# Patient Record
Sex: Female | Born: 2000 | Race: Black or African American | Hispanic: No | Marital: Single | Smoking: Never smoker
Health system: Southern US, Community
[De-identification: ages and names within clinical notes are randomized; demographics above are authoritative.]

## PROBLEM LIST (undated history)

## (undated) DIAGNOSIS — F419 Anxiety disorder, unspecified: Secondary | ICD-10-CM

## (undated) DIAGNOSIS — Z8249 Family history of ischemic heart disease and other diseases of the circulatory system: Secondary | ICD-10-CM

## (undated) DIAGNOSIS — D509 Iron deficiency anemia, unspecified: Secondary | ICD-10-CM

## (undated) DIAGNOSIS — R002 Palpitations: Secondary | ICD-10-CM

## (undated) DIAGNOSIS — Z8049 Family history of malignant neoplasm of other genital organs: Secondary | ICD-10-CM

## (undated) DIAGNOSIS — Z8042 Family history of malignant neoplasm of prostate: Secondary | ICD-10-CM

## (undated) HISTORY — PX: WISDOM TOOTH EXTRACTION: SHX21

## (undated) HISTORY — DX: Anxiety disorder, unspecified: F41.9

## (undated) HISTORY — DX: Family history of malignant neoplasm of prostate: Z80.42

## (undated) HISTORY — DX: Family history of ischemic heart disease and other diseases of the circulatory system: Z82.49

## (undated) HISTORY — DX: Palpitations: R00.2

## (undated) HISTORY — DX: Family history of malignant neoplasm of other genital organs: Z80.49

---
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
## 2019-07-18 DIAGNOSIS — M25571 Pain in right ankle and joints of right foot: Secondary | ICD-10-CM | POA: Diagnosis not present

## 2019-09-23 DIAGNOSIS — F909 Attention-deficit hyperactivity disorder, unspecified type: Secondary | ICD-10-CM | POA: Diagnosis not present

## 2019-09-30 DIAGNOSIS — F909 Attention-deficit hyperactivity disorder, unspecified type: Secondary | ICD-10-CM | POA: Diagnosis not present

## 2019-10-07 DIAGNOSIS — F909 Attention-deficit hyperactivity disorder, unspecified type: Secondary | ICD-10-CM | POA: Diagnosis not present

## 2019-10-29 DIAGNOSIS — Z20822 Contact with and (suspected) exposure to covid-19: Secondary | ICD-10-CM | POA: Diagnosis not present

## 2019-11-04 DIAGNOSIS — F909 Attention-deficit hyperactivity disorder, unspecified type: Secondary | ICD-10-CM | POA: Diagnosis not present

## 2019-11-17 DIAGNOSIS — Z20822 Contact with and (suspected) exposure to covid-19: Secondary | ICD-10-CM | POA: Diagnosis not present

## 2019-11-18 DIAGNOSIS — F909 Attention-deficit hyperactivity disorder, unspecified type: Secondary | ICD-10-CM | POA: Diagnosis not present

## 2019-12-12 DIAGNOSIS — F419 Anxiety disorder, unspecified: Secondary | ICD-10-CM | POA: Diagnosis not present

## 2019-12-30 DIAGNOSIS — F909 Attention-deficit hyperactivity disorder, unspecified type: Secondary | ICD-10-CM | POA: Diagnosis not present

## 2020-01-06 DIAGNOSIS — F419 Anxiety disorder, unspecified: Secondary | ICD-10-CM | POA: Diagnosis not present

## 2020-01-27 DIAGNOSIS — F909 Attention-deficit hyperactivity disorder, unspecified type: Secondary | ICD-10-CM | POA: Diagnosis not present

## 2020-02-04 DIAGNOSIS — H5213 Myopia, bilateral: Secondary | ICD-10-CM | POA: Diagnosis not present

## 2020-02-12 DIAGNOSIS — H5213 Myopia, bilateral: Secondary | ICD-10-CM | POA: Diagnosis not present

## 2020-02-25 DIAGNOSIS — F909 Attention-deficit hyperactivity disorder, unspecified type: Secondary | ICD-10-CM | POA: Diagnosis not present

## 2020-03-10 DIAGNOSIS — F909 Attention-deficit hyperactivity disorder, unspecified type: Secondary | ICD-10-CM | POA: Diagnosis not present

## 2020-03-24 DIAGNOSIS — F909 Attention-deficit hyperactivity disorder, unspecified type: Secondary | ICD-10-CM | POA: Diagnosis not present

## 2020-03-31 ENCOUNTER — Ambulatory Visit (INDEPENDENT_AMBULATORY_CARE_PROVIDER_SITE_OTHER): Payer: Medicaid Other | Admitting: Family Medicine

## 2020-03-31 ENCOUNTER — Other Ambulatory Visit: Payer: Self-pay

## 2020-03-31 VITALS — BP 118/72 | HR 112 | Ht 70.0 in | Wt 156.8 lb

## 2020-03-31 DIAGNOSIS — G8929 Other chronic pain: Secondary | ICD-10-CM | POA: Diagnosis not present

## 2020-03-31 DIAGNOSIS — M25562 Pain in left knee: Secondary | ICD-10-CM | POA: Diagnosis not present

## 2020-03-31 DIAGNOSIS — M546 Pain in thoracic spine: Secondary | ICD-10-CM | POA: Diagnosis not present

## 2020-03-31 DIAGNOSIS — M25561 Pain in right knee: Secondary | ICD-10-CM | POA: Insufficient documentation

## 2020-03-31 DIAGNOSIS — M549 Dorsalgia, unspecified: Secondary | ICD-10-CM | POA: Insufficient documentation

## 2020-03-31 HISTORY — DX: Pain in left knee: M25.562

## 2020-03-31 HISTORY — DX: Dorsalgia, unspecified: M54.9

## 2020-03-31 NOTE — Assessment & Plan Note (Signed)
Patient with 3 months of left knee pain without known cause.  Does have a recent history of prolonged kneeling and increased bending due to her job requirements.  On exam no obvious deformities, full active range of motion.  She did have some tenderness to superior patella and inferior quadriceps muscles.  Positive patellar grind test though this was semilimited as patient was having a very hard time contracting her quadriceps muscles.  Other specialized tests negative as above.  History and exam most consistent with patellofemoral pain.  Referral sent for sports medicine for further evaluation.  Patient may also benefit from referral to physical therapy.  Discussed with patient about the importance of exercise and strengthening her muscles and also trial of NSAID.

## 2020-03-31 NOTE — Assessment & Plan Note (Signed)
Patient with mid thoracic back pain and associated tingling that radiates to her right scapula for the past 6 months.  Previously had radiation down her left arm consistent with radiculopathy though this has since resolved.  She had a recent massage which helped to improve the pain.  This leads me to suspect a more muscular component.  No abnormalities noted on exam.  Normal spinal curve and less concerned for scoliosis though she does have a family history.  Patient has not trialed heat or NSAIDs to the area.  Also with chronic poor posture.  No red flag symptoms and thus do not feel imaging is indicated at this time.  Have sent referral to sports medicine.  Discussed with patient applying heat to the area and NSAID use.  Also discussed importance of exercise and proper posture.  Feel that patient could benefit from physical therapy referral in the future as well.  She will follow up with me in 1 month.

## 2020-03-31 NOTE — Patient Instructions (Addendum)
It was wonderful to see you today.  Please bring ALL of your medications with you to every visit.   Today we talked about:  Your knee pain and back pain.  I will send a referral for sports medicine.  They can do an ultrasound of your knee if they feel it is warranted.  I would try ibuprofen for both your knee and back pain.  You can take 600 mg every 8 hours with meals.  I do believe that your back pain is likely muscle related.  You can try heat packs for this as well.  Continue to improve on your posture.  Exercise is also important and will likely help.  I do not feel we need to order any x-rays at this time.  I would like to see you back in a month to see how you are doing.  Thank you for choosing [facility].   Please call [phone] with any questions about today's appointment.  Please be sure to schedule follow up at the front  desk before you leave today.   Sabino Dick, DO PGY-1 Family Medicine

## 2020-03-31 NOTE — Progress Notes (Signed)
SUBJECTIVE:   CHIEF COMPLAINT / HPI:   Knee Pain Patient presents complaining of 3 months of left knee pain.  Describes the pain as sharp with occasional radiation up her thigh.  No known injury.  Pain is exacerbated with walking up and down stairs and also walking in general.  Patient tells me that she was recently working at a job that required her to do a lot of bending and kneeling.  She has since stopped working there as of 2 weeks ago.  She has tried ice and hot packs to the area without relief.  She has not tried a medication specifically for this though tells me that she takes 1000 mg of Tylenol for her menstrual cramps and this has not touched the pain.  Denies swelling, bruises, snaps/pops/clicks to the area.  She has also tried a knee brace without improvement.  No trouble sleeping. Patient does not do any exercise besides walking a mile to and from school daily.   Back Pain  Patient complains of upper to mid back pain and tingling for the last 6 months.  States that the pain occasionally travels to her right shoulder blade.  Has these pains daily.  Notes the pain is worsened when she is slouching, bending, or reclining in a chair.  Also occasionally hurts when she sits up straight.  No trouble sleeping.  Patient does state that she received a massage to the area which seemed to help.  She does not take any medication for this specifically though, as above, does take occasional Tylenol for her menstrual cramps which do not help this pain.  Throughout the last 6 months the pain seems to have stayed the same severity.  She tells me that there is a family history of scoliosis.  She is also concerned of possible pinched nerve as she has had tingling down her left arm in the past.  Denies current tingling in her left arm.  PERTINENT  PMH / PSH: No past medical history on file.   OBJECTIVE:   BP 118/72   Pulse (!) 112   Ht 5\' 10"  (1.778 m)   Wt 156 lb 12.8 oz (71.1 kg)   SpO2 98%   BMI  22.50 kg/m   Gen: Alert, in no acute distress Resp: Speaking in full sentences, no respiratory distress MSK: Mid-thoracic midline tenderness. Tenderness to right scapula. Increased muscle tone in thoracic region. No erythema, no deformities. Normal spinal curve. Weak quadriceps muscles- was having a hard time contracting quads on exam.  Left knee: +Patellar grind. Mild pain on palpation to superior patella, inferior quadriceps. Negative anterior and posterior drawer. Negative varus and valgus stress test. Negative bulge test. No patellar subluxation. No swelling to the area. No obvious deformity. Full active ROM.   Right knee: Full active ROM. Negative anterior and posterior drawer. No tenderness along patella or joint lines. No swelling. No deformity.   ASSESSMENT/PLAN:   Left knee pain Patient with 3 months of left knee pain without known cause.  Does have a recent history of prolonged kneeling and increased bending due to her job requirements.  On exam no obvious deformities, full active range of motion.  She did have some tenderness to superior patella and inferior quadriceps muscles.  Positive patellar grind test though this was semilimited as patient was having a very hard time contracting her quadriceps muscles.  Other specialized tests negative as above.  History and exam most consistent with patellofemoral pain.  Referral sent for  sports medicine for further evaluation.  Patient may also benefit from referral to physical therapy.  Discussed with patient about the importance of exercise and strengthening her muscles and also trial of NSAID.   Back pain Patient with mid thoracic back pain and associated tingling that radiates to her right scapula for the past 6 months.  Previously had radiation down her left arm consistent with radiculopathy though this has since resolved.  She had a recent massage which helped to improve the pain.  This leads me to suspect a more muscular component.  No  abnormalities noted on exam.  Normal spinal curve and less concerned for scoliosis though she does have a family history.  Patient has not trialed heat or NSAIDs to the area.  Also with chronic poor posture.  No red flag symptoms and thus do not feel imaging is indicated at this time.  Have sent referral to sports medicine.  Discussed with patient applying heat to the area and NSAID use.  Also discussed importance of exercise and proper posture.  Feel that patient could benefit from physical therapy referral in the future as well.  She will follow up with me in 1 month.     Sabino Dick, DO [facility]

## 2020-04-02 ENCOUNTER — Ambulatory Visit (INDEPENDENT_AMBULATORY_CARE_PROVIDER_SITE_OTHER): Payer: Medicaid Other | Admitting: Family Medicine

## 2020-04-02 ENCOUNTER — Other Ambulatory Visit: Payer: Self-pay

## 2020-04-02 ENCOUNTER — Other Ambulatory Visit: Payer: Self-pay | Admitting: Family Medicine

## 2020-04-02 ENCOUNTER — Ambulatory Visit
Admission: RE | Admit: 2020-04-02 | Discharge: 2020-04-02 | Disposition: A | Discharge status: Home / Self Care | Payer: Medicaid Other | Source: Ambulatory Visit | Attending: Sports Medicine | Admitting: Sports Medicine

## 2020-04-02 VITALS — BP 100/72 | Ht 70.0 in | Wt 157.0 lb

## 2020-04-02 DIAGNOSIS — M2141 Flat foot [pes planus] (acquired), right foot: Secondary | ICD-10-CM

## 2020-04-02 DIAGNOSIS — M357 Hypermobility syndrome: Secondary | ICD-10-CM

## 2020-04-02 DIAGNOSIS — G8929 Other chronic pain: Secondary | ICD-10-CM

## 2020-04-02 DIAGNOSIS — M25561 Pain in right knee: Secondary | ICD-10-CM | POA: Diagnosis not present

## 2020-04-02 DIAGNOSIS — M545 Low back pain, unspecified: Secondary | ICD-10-CM

## 2020-04-02 DIAGNOSIS — Z8739 Personal history of other diseases of the musculoskeletal system and connective tissue: Secondary | ICD-10-CM | POA: Diagnosis not present

## 2020-04-02 DIAGNOSIS — M549 Dorsalgia, unspecified: Secondary | ICD-10-CM | POA: Diagnosis not present

## 2020-04-02 DIAGNOSIS — M25562 Pain in left knee: Secondary | ICD-10-CM | POA: Diagnosis not present

## 2020-04-02 DIAGNOSIS — M2142 Flat foot [pes planus] (acquired), left foot: Secondary | ICD-10-CM | POA: Diagnosis not present

## 2020-04-02 NOTE — Patient Instructions (Signed)
Your knee pain is likely due to a confluence of leg length discrepancy, flat feet, and quad imbalance.  - Do the exercises recommended to you at least daily, to help strengthen your quads to improve your pain.  - It's likely your back pain is also brought on by your leg length changes, and we're going to order a scoliosis survey to help screen for this underlying your symptoms.  -Get your X-rays today and [facility], and I'll call you with the results.  -Do the back exercises daily to help strengthen the muscles that support your spine.  Once you start to feel better, it's still a good idea to do these exercises a few times weekly to prevent return of pain.   The shoe pads you were given today can be ordered directly from [email or web address] - 3/16" heel lift on the LEFT shoe - Medium sized Scaphoid pads for arch support  We do make custom orthotics here as well. If you find the shoe pads offer you good benefit and you'd like a more permanent pair, check with your insurance to see if they cover 'Museum/gallery exhibitions officer.'

## 2020-04-02 NOTE — Progress Notes (Signed)
Office Visit Note   Patient: Diane Zuniga           Date of Birth: Sep 27, 2000           MRN: [id number] Visit Date: 04/02/2020 Requested by: Carney Living, MD [address] [place],  [place] [address] PCP: Carney Living, MD  Subjective: CC: Left Knee pain, Mid back pain  HPI: Patient is a pleasant 19 year old female presenting to clinic with several months of bilateral knee pain, foot pain, and back pain.  She denies any trauma prior to the onset of the symptoms, and states that they have insidiously worsened over the past several months.  She does work on her feet throughout the day, often pulling 12-hour shifts.  She says that she feels as though her pain has worsened since she started her job.  She is concerned that she has a strong family history of scoliosis, and is worried that she may have the same.  She also says as though she feels "lopsided."  She states that she was given ibuprofen by her primary care provider, and this helps alleviate her symptoms.  She does state that she is very flexible, and "does not like to stretch" because she is scared she will hurt herself.              ROS:   All other systems were reviewed and are negative.  Objective: Vital Signs: BP 100/72   Ht 5\' 10"  (1.778 m)   Wt 157 lb (71.2 kg)   BMI 22.53 kg/m   Physical Exam:  General:  Alert and oriented, in no acute distress. Pulm:  Breathing unlabored. Psy:  Normal mood, congruent affect. Skin: No bruising or rashes appreciated. Musculoskeletal: No marfanoid appearance. Patient stands with knees in hyperextension of approximately 10 degrees.  Able to hyperextend elbows to a similar 10 degrees. Able to touch thumb to anterior forearm. Unable to place palms on floor with forward hinge. Able to extend pinky to approximately 90 degrees. Negative thumb sign. Right ASIS superior.  Right sided rib hump with forward flexion.  With tenderness to palpation along thoracic  paraspinal musculatures bilaterally, R>L.  No midline tenderness or step-offs.  Normal gait, normal spinal lumbar lordosis and thoracic kyphosis. No varus or valgus deformity of the knee, and appropriate Q angle of the hip. Significant bilateral pes planus.  Positive J sign bilaterally  Tenderness to palpation along patella bilaterally as well as patellar facets.  No tenderness over patellar tendon, or medial or lateral joint lines.  No pain or laxity with anterior posterior drawer. No pain or laxity with varus or valgus stress across the knee. No pain or clicking with McMurray. Thessaly negative.  When supine, patient's right medial malleoli appears approximately 1.5 cm inferior to the left.  5 out of 5 strength with knee flexion and extension, as well as ankle dorsi and plantar flexion.  5 out of 5 strength with hip flexion, abduction, adduction.  Sensation intact bilateral lower extremities.    Imaging: No results found.  Assessment & Plan: 19 year old female presenting to clinic with concerns of chronic bilateral knee and lower back pain.  Examination as above, which was concerning for high hypermobility score.  Beighton score of approximately 8/9, with all but palms on floor positive. -Given her significant pes planus and leg length discrepancy, patient was fitted with scaphoid pads as well as 3/16 inch heel lift on her shorter (left) leg. -Does have right-sided rib hump with forward flexion, and  states she has a strong family history of scoliosis.  Will obtain scoliosis survey.  -Educated on isometric knee and core exercises she can do to help improve her symptoms.  Discussed that she should not do stretching exercises beyond the "normal" range of motion. -Follow-up in 3 to 4 weeks after wearing shoe inserts and performing isometric exercises for reevaluation. -Patient expresses understanding, and has no further questions or concerns today.   I was the preceptor for this  visit and available for immediate consultation Marsa Aris, DO

## 2020-04-08 DIAGNOSIS — F909 Attention-deficit hyperactivity disorder, unspecified type: Secondary | ICD-10-CM | POA: Diagnosis not present

## 2020-04-21 DIAGNOSIS — F909 Attention-deficit hyperactivity disorder, unspecified type: Secondary | ICD-10-CM | POA: Diagnosis not present

## 2020-04-28 ENCOUNTER — Ambulatory Visit: Payer: Medicaid Other | Attending: Sports Medicine | Admitting: Physical Therapy

## 2020-04-28 ENCOUNTER — Other Ambulatory Visit: Payer: Self-pay

## 2020-04-28 ENCOUNTER — Encounter: Payer: Self-pay | Admitting: Physical Therapy

## 2020-04-28 DIAGNOSIS — M545 Low back pain, unspecified: Secondary | ICD-10-CM | POA: Insufficient documentation

## 2020-04-28 DIAGNOSIS — G8929 Other chronic pain: Secondary | ICD-10-CM | POA: Diagnosis not present

## 2020-04-28 DIAGNOSIS — M6281 Muscle weakness (generalized): Secondary | ICD-10-CM | POA: Insufficient documentation

## 2020-04-28 DIAGNOSIS — R293 Abnormal posture: Secondary | ICD-10-CM | POA: Insufficient documentation

## 2020-04-29 NOTE — Therapy (Addendum)
[facility] [address] [place], [place], [address] Phone: [phone]   Fax:  [phone]  Physical Therapy Evaluation  Patient Details  Name: Diane Zuniga MRN: [id number] Date of Birth: 09/13/2000 Referring Provider (PT): Dr. Reino Bellis   Encounter Date: 04/28/2020   PT End of Session - 04/29/20 1254    Visit Number 1    Number of Visits 8    Date for PT Re-Evaluation 06/24/20    Authorization Type MCD    PT Start Time 1500    PT Stop Time 1538    PT Time Calculation (min) 38 min    Activity Tolerance Patient tolerated treatment well    Behavior During Therapy [facility] for tasks assessed/performed           History reviewed. No pertinent past medical history.  History reviewed. No pertinent surgical history.  There were no vitals filed for this visit.    Subjective Assessment - 04/28/20 1504    Subjective Patient noticed increased back pain in adolescence , always attributed to posture, bookbags.  Pain has become more consistent and increasing , radiating to L UE .  L knee hurts sporadically.  Back pain prevents her from getting out of the bed at times, it is increasingly stiff. Sitting is also an issue, lifting groceries (walking up stairs).  Reports weakness in UE with using her arms for cooking, lifting.    Pertinent History recent diagnosed with scoliosis    Limitations Standing;House hold activities;Sitting;Lifting    Diagnostic tests XR scoliosis    Patient Stated Goals Patient would like to improve pain and avoid using meds    Currently in Pain? Yes    Pain Score 3     Pain Location Back    Pain Orientation Mid;Lower    Pain Descriptors / Indicators Aching;Burning;Sore    Pain Type Chronic pain    Pain Radiating Towards low back    Pain Onset More than a month ago    Pain Frequency Intermittent    Aggravating Factors  sitting    Pain Relieving Factors stretching her back    Effect of Pain on Daily  Activities makes things uncomfortable              [facility] PT Assessment - 04/29/20 0001      Assessment   Medical Diagnosis low back pain     Referring Provider (PT) Dr. Reino Bellis    Onset Date/Surgical Date --   chronic   Prior Therapy No      Precautions   Precautions None      Restrictions   Weight Bearing Restrictions No      Balance Screen   Has the patient fallen in the past 6 months No    Has the patient had a decrease in activity level because of a fear of falling?  No    Is the patient reluctant to leave their home because of a fear of falling?  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[employer] , music      Cognition   Overall Cognitive Status Within Functional Limits for tasks assessed      Observation/Other Assessments   Focus on Therapeutic Outcomes (FOTO)  NT due to MCD      Sensation   Light Touch Impaired by gross assessment    Additional Comments Rt side under scapula tingling       Posture/Postural Control   Posture/Postural Control Postural limitations    Posture Comments high Rt scap high R hip    wears lift in LLE     AROM   Lumbar Flexion 50% tight hamstrings    Lumbar Extension 25% min back pain    Lumbar - Right Side Bend WFL    Lumbar - Left Side Bend WFL    Lumbar - Right Rotation WFL    Lumbar - Left Rotation [facility]      Strength   Overall Strength Comments knees and hips WFL    Right Shoulder Flexion 4/5    Right Shoulder ABduction 4/5     Left Shoulder Flexion 4/5    Left Shoulder ABduction 4/5      Flexibility   Hamstrings tight, < 50 deg      Palpation   Spinal mobility WNL    Palpation comment pain in Bilateral quadratus lumborum and thoracic spine                      Objective measurements completed on examination: See above findings.               PT Education - 04/29/20 1254    Education Details PT/POC, HEP, scoliosis , core    Person(s) Educated Patient    Methods Explanation;Demonstration;Verbal cues;Handout    Comprehension Verbalized understanding;Returned demonstration            PT Short Term Goals - 04/29/20 1257      PT SHORT TERM GOAL #1   Title Pt will be I with HEP for core, posture    Baseline unknown    Time 4    Period Weeks    Status New    Target Date 05/26/20      PT SHORT TERM GOAL #2   Title Pt will notice less discomfort in mid, lower back with sitting for school work, 30 min    Baseline limited to < 15 min    Time 4    Period Weeks    Status New    Target Date 05/26/20             PT Long Term Goals - 04/29/20 1258      PT LONG TERM GOAL #1   Title Pt will be able to get out of bed with no pain , just min stiffness in back, improved 75%    Baseline very difficult some days    Time 8    Period Weeks    Status New    Target Date 06/23/20      PT LONG TERM GOAL #2   Title Pt will be able to sit as needed with min occasional back pain    Baseline unable to do for school, work    Time 8    Period Weeks    Status New    Target Date 06/23/20      PT LONG TERM GOAL #3   Title Pt will be able to  lift, carry bookbag without increased pain    Baseline increased pain    Time 8    Period Weeks    Status New    Target Date 06/23/20      PT LONG TERM GOAL #4   Title Pt will be I with HEP for long term health and spine health.    Baseline unknown, does not exercise    Time 8    Period Weeks    Status New    Target Date 06/23/20                   Plan - 04/29/20 1253    Clinical Impression Statement Pt presents for low complexity eval of low back and mid back pain due to chronic postural imbalances , newly diagnosed scoliosis.  She has a mild S curve, leg length discrepancy and core weakness. She has tightness in her R>L. quadratus lumborum.  She does not exercise but was given HEP for spinal mobility.  She may return for more PT but with school and work she is limited with time.    Personal Factors and Comorbidities Age;Behavior Pattern;Time since onset of injury/illness/exacerbation    Examination-Activity Limitations Sit;Bed Mobility;Lift;Squat;Stand;Carry;Reach Overhead    Examination-Participation Restrictions Interpersonal Relationship;Occupation;School;Driving;Meal Prep    Stability/Clinical Decision Making Stable/Uncomplicated    Clinical Decision Making Low    Rehab Potential Excellent    PT Frequency 1x / week    PT Duration 8 weeks    PT Treatment/Interventions ADLs/Self Care Home Management;Cryotherapy;Patient/family education;Taping;Therapeutic exercise;Moist Heat;Therapeutic activities;Neuromuscular re-education;Dry needling;Functional mobility training;Passive range of motion;Manual techniques    PT Next Visit Plan review HEP, begin core and lifting    PT Home Exercise Plan [email or web address]    Consulted and Agree with Plan of Care Patient           Patient will benefit from skilled therapeutic intervention in order to improve the following deficits and impairments:  Decreased activity tolerance,Decreased strength,Increased fascial restricitons,Impaired flexibility,Impaired UE functional use,Postural dysfunction,Pain,Decreased range of motion,Increased muscle spasms,Decreased mobility  Visit Diagnosis: Abnormal posture  Muscle weakness (generalized)  Chronic bilateral low back pain without sciatica     Problem List Patient Active Problem List   Diagnosis Date Noted  . Left knee pain 03/31/2020  .  Back pain 03/31/2020    Leasia Swann 04/29/2020, 1:27 PM  [facility] [address] [place], [place], [address] Phone: [phone]   Fax:  [phone]  Name: Tkeya Stencil MRN: [id number] Date of Birth: 2000/08/26   Check all possible CPT codes: Check all possible CPT codes: 97110- Therapeutic Exercise, [phone]- Neuro Re-education, [phone] - Gait Training, [phone] - Manual Therapy, [phone] - Therapeutic Activities, [phone] - Self Care, [phone] - Electrical stimulation (unattended) and 97035 - Ultrasound                 Karie Mainland, PT 04/29/20 1:28 PM Phone: [phone] Fax: [phone]

## 2020-04-29 NOTE — Patient Instructions (Addendum)
  Access Code: [email or web address]   URL: [email or web address]: 12/09/2021Prepared by: Victorino Dike PaaExercises  Cat-Camel to Child's Pose - 1 x daily - 7 x weekly - 2 sets - 10 reps - 10-15 hold  Child's Pose with Sidebending - 1 x daily - 7 x weekly - 2 sets - 5 reps - 10-15 hold  Child's Pose with Thread the Needle - 1 x daily - 7 x weekly - 1 sets - 5 reps - 10-15 hold  Sidelying Quadratus Lumborum Stretch on Table - 1 x daily - 7 x weekly - 1 sets - 3 reps - 30 hold  Corner Pec Major Stretch - 1 x daily - 7 x weekly - 1 sets - 3 reps - 30 hold

## 2020-04-30 ENCOUNTER — Ambulatory Visit (INDEPENDENT_AMBULATORY_CARE_PROVIDER_SITE_OTHER): Payer: Medicaid Other | Admitting: Family Medicine

## 2020-04-30 ENCOUNTER — Encounter: Payer: Self-pay | Admitting: Family Medicine

## 2020-04-30 ENCOUNTER — Ambulatory Visit (HOSPITAL_COMMUNITY)
Admission: RE | Admit: 2020-04-30 | Discharge: 2020-04-30 | Disposition: A | Discharge status: Home / Self Care | Payer: Medicaid Other | Source: Ambulatory Visit | Attending: Family Medicine | Admitting: Family Medicine

## 2020-04-30 ENCOUNTER — Other Ambulatory Visit: Payer: Self-pay

## 2020-04-30 VITALS — BP 108/66 | HR 119 | Ht 70.0 in | Wt 156.4 lb

## 2020-04-30 DIAGNOSIS — Z8249 Family history of ischemic heart disease and other diseases of the circulatory system: Secondary | ICD-10-CM | POA: Diagnosis not present

## 2020-04-30 DIAGNOSIS — F419 Anxiety disorder, unspecified: Secondary | ICD-10-CM | POA: Insufficient documentation

## 2020-04-30 NOTE — Patient Instructions (Signed)
It was wonderful to see you today!  Today we discussed your family history of cardiomyopathy.  We performed an EKG in the office that was not obvious for any abnormalities, though I still believe it is best to get an echo, or ultrasound of your heart to better assess this. I will give you a call back with the echocardiogram results.  I have also placed a referral to a cardiologist so that you can establish care.   Additionally, thank you for opening up to me about your trauma. I want to support you as best as I can, please do not hesitate to reach back out if you need anything.  Thank you for choosing [facility].   Please call [phone] with any questions about today's appointment.  Please be sure to schedule follow up at the front  desk before you leave today.   Sabino Dick, DO PGY-1 Family Medicine

## 2020-04-30 NOTE — Assessment & Plan Note (Addendum)
Patient with strong family history of cardiomyopathy.  Her mother unfortunately passed at the age of 35 and her brother has been hospitalized for this.  She has an adopted mother so her maternal family history is unknown.  Patient has never had an echocardiogram performed.  EKG at the office with sinus arrhythmia and "possible left atrial enlargement". Per my calculations with preceptor Dr. Pollie Meyer, patient does not meet criteria for LVH. With significant family history patient should be screened with echocardiogram. I have placed an order for echo and referral to cardiology to establish care.

## 2020-04-30 NOTE — Progress Notes (Signed)
SUBJECTIVE:   CHIEF COMPLAINT / HPI:   Screening for Cardiomyopathy  Patient presents for screening for cardiomyopathy.  Tells me that her mom passed at the age of 34 suddenly while sleeping.  She was unfortunately found to have cardiomyopathy.  Her brother also presented to the hospital at the age of 20 and was found to also have cardiomyopathy and was placed on a medication that she believes began with a "C", may have been carvedilol.  Patient denies syncope, feeling lightheaded or dizzy.  States that she occasionally has chest pain with running but does not run very often.  Occasionally feels palpitations when she awakens from sleep.  Has not been involved in many sports but was active in the marching band and played the trumpet.  Felt that it was "harder" for her compared to her classmates but is not sure if it was because she had a poor diet at that time.  States that she has had 1 EKG in the past that was without abnormalities.  Would like more imaging done.   Anxiety Patient actually found her mother deceased because she was laying in the bed next to her.  States that she was unable to arouse her mother and put her ear to her chest and did not hear a heartbeat.  The patient was 19 years old at the time.  States that she now has anxiety when going to bed.  She is seeing a therapist but feels that her anxiety will be best managed when she starts the proper screening or medication for this.  She sometimes runs for anxiety this helps her chest pain   PERTINENT  PMH / PSH: No past medical history on file.   OBJECTIVE:   BP 108/66   Pulse (!) 119   Ht 5\' 10"  (1.778 m)   Wt 156 lb 6.4 oz (70.9 kg)   LMP 04/22/2020 (Exact Date)   SpO2 97%   BMI 22.44 kg/m    General: NAD, pleasant, able to participate in exam Cardiac: Tachycardic, no murmur appreciated on squatting or standing  Respiratory: CTAB, normal effort, No wheezes, rales or rhonchi Extremities: no cyanosis. Skin: warm and  dry Neuro: alert, no obvious focal deficits Psych: Normal affect and mood  Depression screen PHQ 2/9 04/30/2020  Decreased Interest 2  Down, Depressed, Hopeless 0  PHQ - 2 Score 2  Altered sleeping 3  Tired, decreased energy 2  Change in appetite 1  Feeling bad or failure about yourself  0  Trouble concentrating 2  Moving slowly or fidgety/restless 0  Suicidal thoughts 0  PHQ-9 Score 10  Difficult doing work/chores Not difficult at all   EKG: Sinus arrhythmia rate 83, without ST changes or LVH  ASSESSMENT/PLAN:   Family history of cardiomyopathy Patient with strong family history of cardiomyopathy.  Her mother unfortunately passed at the age of 69 and her brother has been hospitalized for this.  She has an adopted mother so her maternal family history is unknown.  Patient has never had an echocardiogram performed.  EKG at the office with sinus arrhythmia and "possible left atrial enlargement". Per my calculations with preceptor Dr. 41, patient does not meet criteria for LVH. With significant family history patient should be screened with echocardiogram. I have placed an order for echo and referral to cardiology to establish care.   Anxiety Patient with anxiety, especially at night.  This is related to her trauma of finding her mother deceased in bed at the age of  8.  PHQ-9 with a total score of 10 but negative for suicidal thoughts.  Patient is currently seeing a therapist and is prescribed medication for her anxiety.  Patient is unsure of the clinic name at this time.  Patient feels that her anxiety will be best managed when she is screened for cardiomyopathy.  Thanked patient for opening up to me about her trauma.  Emphasized that I would love to support her as best as possible and to not hesitate to reach out.     Sabino Dick, DO [facility]    This note was prepared using Dragon voice recognition software and may include unintentional  dictation errors due to the inherent limitations of voice recognition software.

## 2020-04-30 NOTE — Assessment & Plan Note (Signed)
Patient with anxiety, especially at night.  This is related to her trauma of finding her mother deceased in bed at the age of 62.  PHQ-9 with a total score of 10 but negative for suicidal thoughts.  Patient is currently seeing a therapist and is prescribed medication for her anxiety.  Patient is unsure of the clinic name at this time.  Patient feels that her anxiety will be best managed when she is screened for cardiomyopathy.  Thanked patient for opening up to me about her trauma.  Emphasized that I would love to support her as best as possible and to not hesitate to reach out.

## 2020-05-03 ENCOUNTER — Telehealth: Payer: Self-pay | Admitting: *Deleted

## 2020-05-03 NOTE — Telephone Encounter (Signed)
Contacted pt to inform them of their appt for  ECHO is scheduled for 05/12/2020 @ [place]  @ 2:00pm. They can enter at entrance C. Graden Hoshino Zimmerman Rumple, CMA

## 2020-05-04 ENCOUNTER — Ambulatory Visit (INDEPENDENT_AMBULATORY_CARE_PROVIDER_SITE_OTHER): Payer: Medicaid Other | Admitting: Sports Medicine

## 2020-05-04 ENCOUNTER — Other Ambulatory Visit: Payer: Self-pay

## 2020-05-04 VITALS — BP 98/62 | Ht 70.0 in | Wt 156.0 lb

## 2020-05-04 DIAGNOSIS — M545 Low back pain, unspecified: Secondary | ICD-10-CM

## 2020-05-04 DIAGNOSIS — G8929 Other chronic pain: Secondary | ICD-10-CM | POA: Diagnosis not present

## 2020-05-04 NOTE — Patient Instructions (Addendum)
Call your insurance company and ask them if they cover/how much for custom orthotics. Custom Orthotics code: [phone] which equals $190 You will be billed an additional code ranging from $105 to $260 depending on the complexity of the visit.  On the high end you would be looking at $450 total or $295 on the low end.

## 2020-05-05 ENCOUNTER — Encounter: Payer: Self-pay | Admitting: Sports Medicine

## 2020-05-05 NOTE — Progress Notes (Signed)
Patient ID: Diane Zuniga, female   DOB: 01/07/2001, 19 y.o.   MRN: [id number]  Patient comes in today for follow-up on her low back pain and bilateral knee pain.  Overall, she is improving.  She recently started physical therapy.  She is also wearing her green sports insoles with scaphoid pads in her shoes and she is requesting a second pair today for her work shoes.  She is also asking about the possibility of custom orthotics.  Physical exam was not repeated today.  We simply discussed her x-ray findings and treatment going forward.  I reviewed her x-ray with her and explained that while she does have a very slight scoliotic curve, she should be reassured that it will not worsen as she gets older.  I do believe that this curve is the reason for some of her discomfort and I have encouraged her to continue with physical therapy until she is ready to be discharged to a home exercise program.  In regards to custom orthotics, I do think they are medically necessary for this patient.  She will discussed this with her insurance company and will return to the office when she is ready for [place] to make those.

## 2020-05-10 DIAGNOSIS — F909 Attention-deficit hyperactivity disorder, unspecified type: Secondary | ICD-10-CM | POA: Diagnosis not present

## 2020-05-12 ENCOUNTER — Ambulatory Visit: Payer: Medicaid Other | Admitting: Physical Therapy

## 2020-05-12 ENCOUNTER — Other Ambulatory Visit: Payer: Self-pay

## 2020-05-12 ENCOUNTER — Encounter: Payer: Self-pay | Admitting: Physical Therapy

## 2020-05-12 ENCOUNTER — Ambulatory Visit (HOSPITAL_COMMUNITY): Payer: Medicaid Other

## 2020-05-12 ENCOUNTER — Telehealth: Payer: Self-pay | Admitting: *Deleted

## 2020-05-12 DIAGNOSIS — G8929 Other chronic pain: Secondary | ICD-10-CM | POA: Diagnosis not present

## 2020-05-12 DIAGNOSIS — R293 Abnormal posture: Secondary | ICD-10-CM | POA: Diagnosis not present

## 2020-05-12 DIAGNOSIS — M545 Low back pain, unspecified: Secondary | ICD-10-CM | POA: Diagnosis not present

## 2020-05-12 DIAGNOSIS — M6281 Muscle weakness (generalized): Secondary | ICD-10-CM

## 2020-05-12 NOTE — Telephone Encounter (Signed)
Called pt and informed her that we were waiting on approval from insurance on her 2D echo.  Appt rescheduled from today to 05/27/20.  PA request faxed to [facility].  Original in my office. Will await response.  Jone Baseman, CMA

## 2020-05-12 NOTE — Therapy (Signed)
[facility] Outpatient Rehabilitation [facility] [address] [place], [place], [address] Phone: [phone]   Fax:  [phone]  Physical Therapy Treatment  Patient Details  Name: Diane Zuniga MRN: [id number] Date of Birth: 29-Aug-2000 Referring Provider (PT): Dr. Reino Bellis   Encounter Date: 05/12/2020   PT End of Session - 05/12/20 1643    Visit Number 2    Number of Visits 8    Date for PT Re-Evaluation 06/24/20    Authorization Type MCD- pending as of 12/22    PT Start Time 1643   pt arrived late   PT Stop Time 1722    PT Time Calculation (min) 39 min    Activity Tolerance Patient tolerated treatment well    Behavior During Therapy [facility] for tasks assessed/performed           History reviewed. No pertinent past medical history.  History reviewed. No pertinent surgical history.  There were no vitals filed for this visit.   Subjective Assessment - 05/12/20 1646    Subjective Rt QL hurts and Lt SIJ feels like it always has to pop.    Patient Stated Goals Patient would like to improve pain and avoid using meds    Currently in Pain? Yes    Pain Score 6     Pain Location Back    Pain Descriptors / Indicators Tightness    Aggravating Factors  sitting, a lot of walking as Engineer, materials                             [facility] Adult PT Treatment/Exercise - 05/12/20 0001      Exercises   Exercises Lumbar      Lumbar Exercises: Supine   AB Set Limitations hooklying with tactile cues    Other Supine Lumbar Exercises dead bug extension      Lumbar Exercises: Sidelying   Other Sidelying Lumbar Exercises Lt sidelying/Rt glut max PRI exercise      Lumbar Exercises: Prone   Other Prone Lumbar Exercises planks knees/elbows      Manual Therapy   Manual Therapy Soft tissue mobilization    Manual therapy comments skilled palpation and monitoring during TPDN    Soft tissue mobilization STM to all muscles needled             Trigger Point Dry Needling - 05/12/20 0001    Consent Given? Yes    Education Handout Provided --   verbal education   Muscles Treated Back/Hip Gluteus maximus;Quadratus lumborum    Gluteus Maximus Response Twitch response elicited;Palpable increased muscle length   left   Quadratus Lumborum Response Twitch response elicited;Palpable increased muscle length   Right               PT Education - 05/12/20 1735    Education Details TPDN & expected outcomes    Person(s) Educated Patient    Methods Explanation    Comprehension Verbalized understanding;Need further instruction            PT Short Term Goals - 04/29/20 1257      PT SHORT TERM GOAL #1   Title Pt will be I with HEP for core, posture    Baseline unknown    Time 4    Period Weeks    Status New    Target Date 05/26/20      PT SHORT TERM GOAL #2   Title Pt will notice less discomfort in mid,  lower back with sitting for school work, 30 min    Baseline limited to < 15 min    Time 4    Period Weeks    Status New    Target Date 05/26/20             PT Long Term Goals - 04/29/20 1258      PT LONG TERM GOAL #1   Title Pt will be able to get out of bed with no pain , just min stiffness in back, improved 75%    Baseline very difficult some days    Time 8    Period Weeks    Status New    Target Date 06/23/20      PT LONG TERM GOAL #2   Title Pt will be able to sit as needed with min occasional back pain    Baseline unable to do for school, work    Time 8    Period Weeks    Status New    Target Date 06/23/20      PT LONG TERM GOAL #3   Title Pt will be able to lift, carry bookbag without increased pain    Baseline increased pain    Time 8    Period Weeks    Status New    Target Date 06/23/20      PT LONG TERM GOAL #4   Title Pt will be I with HEP for long term health and spine health.    Baseline unknown, does not exercise    Time 8    Period Weeks    Status New    Target Date 06/23/20                  Plan - 05/12/20 1736    Clinical Impression Statement Pt reported feeling like she can stand up easier following treatment today. Significant spasm reduced with DN and PRI used to open Rt rib cage. Will continue with PRI at next visit.    PT Treatment/Interventions ADLs/Self Care Home Management;Cryotherapy;Patient/family education;Taping;Therapeutic exercise;Moist Heat;Therapeutic activities;Neuromuscular re-education;Dry needling;Functional mobility training;Passive range of motion;Manual techniques    PT Next Visit Plan outcome of DN? cont PRI    PT Home Exercise Plan [id number], Lt sidelying/Rt glut max    Consulted and Agree with Plan of Care Patient           Patient will benefit from skilled therapeutic intervention in order to improve the following deficits and impairments:  Decreased activity tolerance,Decreased strength,Increased fascial restricitons,Impaired flexibility,Impaired UE functional use,Postural dysfunction,Pain,Decreased range of motion,Increased muscle spasms,Decreased mobility  Visit Diagnosis: Abnormal posture  Muscle weakness (generalized)  Chronic bilateral low back pain without sciatica     Problem List Patient Active Problem List   Diagnosis Date Noted  . Family history of cardiomyopathy 04/30/2020  . Anxiety 04/30/2020  . Left knee pain 03/31/2020  . Back pain 03/31/2020    Francessca Friis C. Gracy Ehly PT, DPT 05/12/20 5:39 PM   [facility] Outpatient Rehabilitation [facility] [address] [place], [place], [address] Phone: [phone]   Fax:  [phone]  Name: Diane Zuniga MRN: [id number] Date of Birth: 29-Sep-2000

## 2020-05-13 DIAGNOSIS — F419 Anxiety disorder, unspecified: Secondary | ICD-10-CM | POA: Diagnosis not present

## 2020-05-19 ENCOUNTER — Other Ambulatory Visit: Payer: Self-pay

## 2020-05-19 ENCOUNTER — Ambulatory Visit: Payer: Medicaid Other | Admitting: Physical Therapy

## 2020-05-19 DIAGNOSIS — M545 Low back pain, unspecified: Secondary | ICD-10-CM | POA: Diagnosis not present

## 2020-05-19 DIAGNOSIS — R293 Abnormal posture: Secondary | ICD-10-CM | POA: Diagnosis not present

## 2020-05-19 DIAGNOSIS — G8929 Other chronic pain: Secondary | ICD-10-CM | POA: Diagnosis not present

## 2020-05-19 DIAGNOSIS — M6281 Muscle weakness (generalized): Secondary | ICD-10-CM

## 2020-05-19 NOTE — Therapy (Addendum)
[facility] Outpatient Rehabilitation [facility] [address] [place], [place], [address] Phone: [phone]   Fax:  [phone]  Physical Therapy Treatment  Patient Details  Name: Diane Zuniga MRN: [id number] Date of Birth: 2001/01/01 Referring Provider (PT): Dr. Reino Bellis   Encounter Date: 05/19/2020   PT End of Session - 05/19/20 1153    Visit Number 3    Number of Visits 8    Date for PT Re-Evaluation 06/24/20    Authorization Type MCD- pending as of 12/22    Authorization Time Period 05/12/2020 - 06/24/2020    Authorization - Visit Number 2    Authorization - Number of Visits 3    PT Start Time 1152   pt arrived late today   PT Stop Time 1232    PT Time Calculation (min) 40 min    Activity Tolerance Patient tolerated treatment well    Behavior During Therapy [facility] for tasks assessed/performed           No past medical history on file.  No past surgical history on file.  There were no vitals filed for this visit.   Subjective Assessment - 05/19/20 1153    Subjective "I am feeling alot better today. still feels like my SIJ has to pop"    Patient Stated Goals Patient would like to improve pain and avoid using meds    Currently in Pain? Yes    Pain Score 3     Pain Location Back    Pain Orientation Right    Pain Descriptors / Indicators Aching    Pain Type Chronic pain    Pain Onset More than a month ago    Pain Frequency Intermittent    Aggravating Factors  unsure    Pain Relieving Factors stretching                                       PT Short Term Goals - 04/29/20 1257      PT SHORT TERM GOAL #1   Title Pt will be I with HEP for core, posture    Baseline unknown    Time 4    Period Weeks    Status New    Target Date 05/26/20      PT SHORT TERM GOAL #2   Title Pt will notice less discomfort in mid, lower back with sitting for school work, 30 min    Baseline limited to < 15 min    Time 4     Period Weeks    Status New    Target Date 05/26/20             PT Long Term Goals - 04/29/20 1258      PT LONG TERM GOAL #1   Title Pt will be able to get out of bed with no pain , just min stiffness in back, improved 75%    Baseline very difficult some days    Time 8    Period Weeks    Status New    Target Date 06/23/20      PT LONG TERM GOAL #2   Title Pt will be able to sit as needed with min occasional back pain    Baseline unable to do for school, work    Time 8    Period Weeks    Status New    Target Date 06/23/20  PT LONG TERM GOAL #3   Title Pt will be able to lift, carry bookbag without increased pain    Baseline increased pain    Time 8    Period Weeks    Status New    Target Date 06/23/20      PT LONG TERM GOAL #4   Title Pt will be I with HEP for long term health and spine health.    Baseline unknown, does not exercise    Time 8    Period Weeks    Status New    Target Date 06/23/20                 Plan - 05/19/20 1240    Clinical Impression Statement pt reported significant releif since the last session. continued TPDN for bil glute med and QL followd with IASTM technqiues and LAD to address hip rotation. cotninued working on core strengthening which she did very well with. end of session she noted feeling like she could stand up better, and decreased.    PT Treatment/Interventions ADLs/Self Care Home Management;Cryotherapy;Patient/family education;Taping;Therapeutic exercise;Moist Heat;Therapeutic activities;Neuromuscular re-education;Dry needling;Functional mobility training;Passive range of motion;Manual techniques    PT Next Visit Plan outcome of DN? cont PRI, address L LE LLD with LAD    PT Home Exercise Plan [id number], Lt sidelying/Rt glut max           Patient will benefit from skilled therapeutic intervention in order to improve the following deficits and impairments:  Decreased activity tolerance,Decreased strength,Increased  fascial restricitons,Impaired flexibility,Impaired UE functional use,Postural dysfunction,Pain,Decreased range of motion,Increased muscle spasms,Decreased mobility  Visit Diagnosis: Abnormal posture  Muscle weakness (generalized)  Chronic bilateral low back pain without sciatica     Problem List Patient Active Problem List   Diagnosis Date Noted  . Family history of cardiomyopathy 04/30/2020  . Anxiety 04/30/2020  . Left knee pain 03/31/2020  . Back pain 03/31/2020   Lulu Riding PT, DPT, LAT, ATC  05/20/20  3:35 PM      [facility] Outpatient Rehabilitation [facility] [address] [place], [place], [address] Phone: [phone]   Fax:  [phone]  Name: Diane Zuniga MRN: [id number] Date of Birth: [birth year]

## 2020-05-25 ENCOUNTER — Encounter: Payer: Self-pay | Admitting: Sports Medicine

## 2020-05-25 ENCOUNTER — Ambulatory Visit (INDEPENDENT_AMBULATORY_CARE_PROVIDER_SITE_OTHER): Payer: Medicaid Other | Admitting: Student in an Organized Health Care Education/Training Program

## 2020-05-25 ENCOUNTER — Encounter: Payer: Self-pay | Admitting: Student in an Organized Health Care Education/Training Program

## 2020-05-25 ENCOUNTER — Other Ambulatory Visit: Payer: Self-pay

## 2020-05-25 ENCOUNTER — Ambulatory Visit (INDEPENDENT_AMBULATORY_CARE_PROVIDER_SITE_OTHER): Payer: Medicaid Other | Admitting: Sports Medicine

## 2020-05-25 VITALS — BP 112/62 | HR 122 | Wt 162.6 lb

## 2020-05-25 VITALS — BP 108/72 | Ht 70.0 in | Wt 156.0 lb

## 2020-05-25 DIAGNOSIS — G8929 Other chronic pain: Secondary | ICD-10-CM

## 2020-05-25 DIAGNOSIS — Z23 Encounter for immunization: Secondary | ICD-10-CM

## 2020-05-25 DIAGNOSIS — M2141 Flat foot [pes planus] (acquired), right foot: Secondary | ICD-10-CM | POA: Diagnosis not present

## 2020-05-25 DIAGNOSIS — M2142 Flat foot [pes planus] (acquired), left foot: Secondary | ICD-10-CM | POA: Diagnosis not present

## 2020-05-25 DIAGNOSIS — M545 Low back pain, unspecified: Secondary | ICD-10-CM

## 2020-05-25 DIAGNOSIS — R599 Enlarged lymph nodes, unspecified: Secondary | ICD-10-CM | POA: Diagnosis present

## 2020-05-25 NOTE — Patient Instructions (Signed)
It was a pleasure to see you today!  To summarize our discussion for this visit:  What I feel on your neck feels like a very normal lymph node to me. i'm reassured that it is small, mobile, soft and not bothering you. They can sometimes get larger if your immune system has found an invader of some sort that it needs to fight off. If you watch it for a couple weeks and it does not go down or if you find that it is growing dramatically, come back and we may get some blood work or imaging of it as an extra precaution.  We're giving you your covid booster today and that has been known to increase lymph node swelling as well so you may notice some more bumps on the side of your vaccine which is normal and should go away after a few weeks if that occurs.    Some additional health maintenance measures we should update are: Health Maintenance Due  Topic Date Due  . Hepatitis C Screening  Never done  . HIV Screening  Never done  .   Call the clinic at [phone] if your symptoms worsen or you have any concerns.   Thank you for allowing me to take part in your care,  Dr. Jamelle Rushing

## 2020-05-25 NOTE — Progress Notes (Signed)
   Covid-19 Vaccination Clinic  Name:  Diane Zuniga    MRN: [id number] DOB: Aug 31, 2000  05/25/2020  [name] was observed post Covid-19 immunization for 15 minutes without incident. She was provided with Vaccine Information Sheet and instruction to access the V-Safe system.   [name] was instructed to call 911 with any severe reactions post vaccine: Marland Kitchen Difficulty breathing  . Swelling of face and throat  . A fast heartbeat  . A bad rash all over body  . Dizziness and weakness

## 2020-05-25 NOTE — Progress Notes (Signed)
    SUBJECTIVE:   CHIEF COMPLAINT / HPI: lump in neck  Yesterday, patient was massaging her neck and incidentally felt a bump on rights side of neck. She has never noticed it before so does not know long it has been there. It is not bothering her at all. She has concerns about it because she has a friend who was diagnosed with cancer recently. She denies rash, fever, weight loss, family history.  Has not had any recent illnesses.  Patient had moderna x2 covid vaccines and would like her booster today.  OBJECTIVE:   BP 112/62   Pulse (!) 122   Wt 162 lb 9.6 oz (73.8 kg)   SpO2 95%   BMI 23.33 kg/m   General: NAD, pleasant, able to participate in exam Head: [place]/AT.   Nose:  Septum midline, neg drainage  Mouth:  MMM, tonsils non-erythematous, non-edematous.   Neck: right anterior cervical chain has solitary superficial mass which is spongy, mobile, <1cm and non-tender to palpation.  Skin: warm and dry, no rashes noted Neuro: alert and oriented Psych: Normal affect and mood  ASSESSMENT/PLAN:   COVID-19 vaccine administered Booster given today and observed >15 minutes without complication Instructed patient that she may experience sick symptoms and notice lymph node enlargement from vaccine which is normal   Lymph node enlargement Exam revealed a likely benign and normal lymph node Asked patient to monitor at home and if it is rapidly enlarging or not improving after several weeks she can come in for re-examination and consider head/neck CT     Leeroy Bock, DO [facility]

## 2020-05-25 NOTE — Progress Notes (Signed)
Patient is a 46 old female with mild scoliosis with associated chronic low back pain and pes planus who presents for fitting for orthotics. She was last seen here on 05/04/2020, after being fitted with a scaphoid pad and left side heel lift to correct for leg length discrepancy (right ~1 cm longer than left). She reports that she has had benefit from this and would like to proceed with orthotics today.   Patient was fitted for a : standard, cushioned, semi-rigid orthotic. The orthotic was heated and afterward the patient stood on the orthotic blank positioned on the orthotic stand. The patient was positioned in subtalar neutral position and 10 degrees of ankle dorsiflexion in a weight bearing stance. After completion of molding, a stable base was applied to the orthotic blank. The blank was ground to a stable position for weight bearing. Size: 8 Base: blue EVA. Right EVA heel was shaved slightly more than left to help correct leg length discrepancy. Posting: none Additional orthotic padding: 3/16" heel pad on L to correct for leg length discrepancy  After orthotics were fashioned, patient was in subtalar neutral position. With testing in her shoes, she reported increased comfort. Her gait was near neutral at that time with very slight right-sided pronation which was improved from previous. Discussed that she can return anytime for adjustments as needed.  Guy Sandifer, MD Sports Medicine Fellow, [facility]  Patient seen and evaluated with the sports medicine fellow.  Orthotics created as above.  Follow-up as needed.

## 2020-05-26 ENCOUNTER — Ambulatory Visit (INDEPENDENT_AMBULATORY_CARE_PROVIDER_SITE_OTHER): Payer: Medicaid Other | Admitting: Interventional Cardiology

## 2020-05-26 ENCOUNTER — Other Ambulatory Visit: Payer: Self-pay

## 2020-05-26 ENCOUNTER — Ambulatory Visit: Payer: Medicaid Other | Attending: Sports Medicine | Admitting: Physical Therapy

## 2020-05-26 ENCOUNTER — Encounter: Payer: Self-pay | Admitting: Interventional Cardiology

## 2020-05-26 VITALS — BP 120/72 | HR 103 | Ht 70.0 in | Wt 162.0 lb

## 2020-05-26 DIAGNOSIS — R599 Enlarged lymph nodes, unspecified: Secondary | ICD-10-CM | POA: Insufficient documentation

## 2020-05-26 DIAGNOSIS — M6281 Muscle weakness (generalized): Secondary | ICD-10-CM | POA: Diagnosis not present

## 2020-05-26 DIAGNOSIS — M545 Low back pain, unspecified: Secondary | ICD-10-CM | POA: Diagnosis not present

## 2020-05-26 DIAGNOSIS — R079 Chest pain, unspecified: Secondary | ICD-10-CM

## 2020-05-26 DIAGNOSIS — Z8249 Family history of ischemic heart disease and other diseases of the circulatory system: Secondary | ICD-10-CM

## 2020-05-26 DIAGNOSIS — G8929 Other chronic pain: Secondary | ICD-10-CM | POA: Diagnosis not present

## 2020-05-26 DIAGNOSIS — M419 Scoliosis, unspecified: Secondary | ICD-10-CM | POA: Diagnosis not present

## 2020-05-26 DIAGNOSIS — Z23 Encounter for immunization: Secondary | ICD-10-CM | POA: Insufficient documentation

## 2020-05-26 DIAGNOSIS — R293 Abnormal posture: Secondary | ICD-10-CM | POA: Diagnosis not present

## 2020-05-26 HISTORY — DX: Encounter for immunization: Z23

## 2020-05-26 NOTE — Assessment & Plan Note (Signed)
Exam revealed a likely benign and normal lymph node Asked patient to monitor at home and if it is rapidly enlarging or not improving after several weeks she can come in for re-examination and consider head/neck CT

## 2020-05-26 NOTE — Patient Instructions (Signed)
Medication Instructions:  Your physician recommends that you continue on your current medications as directed. Please refer to the Current Medication list given to you today.  If you need a refill on your cardiac medications before your next appointment, please call your pharmacy.   Lab work: None Ordered  If you have labs (blood work) drawn today and your tests are completely normal, you will receive your results only by: Marland Kitchen MyChart Message (if you have MyChart) OR . A paper copy in the mail If you have any lab test that is abnormal or we need to change your treatment, we will call you to review the results.  Testing/Procedures: None ordered  Follow-Up: . Based on echocardiogram results  Any Other Special Instructions Will Be Listed Below (If Applicable).

## 2020-05-26 NOTE — Progress Notes (Signed)
Cardiology Office Note   Date:  05/26/2020   ID:  Diane Zuniga, DOB Dec 19, 2000, MRN [id number]  PCP:  Sabino Dick, DO    No chief complaint on file.  Family history of cardiomyopathy  Wt Readings from Last 3 Encounters:  05/26/20 162 lb (73.5 kg)  05/25/20 162 lb 9.6 oz (73.8 kg)  05/25/20 156 lb (70.8 kg)       History of Present Illness: Diane Zuniga is a 20 y.o. female who is being seen today for the evaluation of cardiomyopathy at the request of Doreene Eland, MD.  Mother had a cardiomyopathy and died at age 62. Brother was diagnosed with a "weak heart" diagnosed at age 84.  She was recently diagnosed with scoliosis and is being treated with therapy and orthotics.   Denies :  Dizziness. Leg edema. Nitroglycerin use. Orthopnea.  Paroxysmal nocturnal dyspnea. Shortness of breath. Syncope.   Rare palpitations when she does not sleep well around final exams. She is a Environmental manager major.  Occasional chest pain- worse with certain movements.  She runs occasionally, and walks regularly for exercise.  Walked a lot on campus, no issues with walking. Also has some issue with her rib anatomy which is being treated with PT.     Past Medical History:  Diagnosis Date  . Anxiety   . Family history of cardiomyopathy   . Palpitations     History reviewed. No pertinent surgical history.   Current Outpatient Medications  Medication Sig Dispense Refill  . atomoxetine (STRATTERA) 100 MG capsule Take 100 mg by mouth every morning.    . busPIRone (BUSPAR) 7.5 MG tablet Take 1 tablet by mouth in the morning and at bedtime.     No current facility-administered medications for this visit.    Allergies:   Patient has no known allergies.    Social History:  The patient  reports that she has never smoked. She has never used smokeless tobacco.   Family History:  The patient's family history includes Cardiomyopathy (age of onset: 57) in her brother; Cardiomyopathy  (age of onset: 62) in her mother.    ROS:  Please see the history of present illness.   Otherwise, review of systems are positive for anxiety.   All other systems are reviewed and negative.    PHYSICAL EXAM: VS:  BP 120/72   Pulse (!) 103   Ht 5\' 10"  (1.778 m)   Wt 162 lb (73.5 kg)   SpO2 95%   BMI 23.24 kg/m  , BMI Body mass index is 23.24 kg/m. GEN: Well nourished, well developed, in no acute distress ; tall HEENT: normal  Neck: no JVD, carotid bruits, or masses Cardiac: RRR; no murmurs, rubs, or gallops,no edema  Respiratory:  clear to auscultation bilaterally, normal work of breathing GI: soft, nontender, nondistended, + BS MS: no deformity or atrophy  Skin: warm and dry, no rash Neuro:  Strength and sensation are intact Psych: euthymic mood, full affect   EKG:   The ekg ordered today demonstrates NSR, IRBBB, sinus arrhythmia, no ST changes   Recent Labs: No results found for requested labs within last 8760 hours.   Lipid Panel No results found for: CHOL, TRIG, HDL, CHOLHDL, VLDL, LDLCALC, LDLDIRECT   Other studies Reviewed: Additional studies/ records that were reviewed today with results demonstrating: ECG reviewed.   ASSESSMENT AND PLAN:  1. Family h/o cardiomyopathy: Check echo. Several members with cardiomyopathy, several members without issues.   2. Chest pain: Atypical chest  pain.  Normal echo will be reassuring.  May be related to scoliosis. 3. No signs of heart failure on exam.  If echo normal, would consider a repeat echo in 5 years given her family history- or sooner if there were signs of heart failure.   Current medicines are reviewed at length with the patient today.  The patient concerns regarding her medicines were addressed.  The following changes have been made:  No change  Labs/ tests ordered today include:  No orders of the defined types were placed in this encounter.   Recommend 150 minutes/week of aerobic exercise Low fat, low carb,  high fiber diet recommended  Disposition:   FU based on echo   Signed, Lance Muss, MD  05/26/2020 11:38 AM    [facility] [address] [place], [place], [place]  [address] Phone: [phone]; Fax: [phone]

## 2020-05-26 NOTE — Patient Instructions (Signed)

## 2020-05-26 NOTE — Therapy (Signed)
[place], [place], [address] Phone: [phone]   Fax:  [phone]  Physical Therapy Treatment  Patient Details  Name: Diane Zuniga MRN: [id number] Date of Birth: 2000/12/25 Referring Provider (PT): Dr. Lilia Argue   Encounter Date: 05/26/2020   PT End of Session - 05/26/20 1552    Visit Number 4    Number of Visits 8    Date for PT Re-Evaluation 06/24/20    Authorization Type MCD- pending as of 12/22    Authorization Time Period 05/12/2020 - 06/24/2020    Authorization - Visit Number 3    Authorization - Number of Visits 3    PT Start Time 7026    PT Stop Time 1628    PT Time Calculation (min) 40 min    Activity Tolerance Patient tolerated treatment well    Behavior During Therapy [facility] for tasks assessed/performed           Past Medical History:  Diagnosis Date  . Anxiety   . Family history of cardiomyopathy   . Palpitations     No past surgical history on file.  There were no vitals filed for this visit.   Subjective Assessment - 05/26/20 1553    Subjective " I am feeling more sore today, i got my orthotics yesterday and my Covid shot so i am very sore."    Patient Stated Goals Patient would like to improve pain and avoid using meds    Currently in Pain? Yes    Pain Score 4     Pain Location Back    Pain Orientation Right    Pain Descriptors / Indicators Aching    Pain Type Chronic pain    Pain Onset More than a month ago    Pain Frequency Intermittent    Aggravating Factors  unsure    Pain Relieving Factors stretching              [facility] PT Assessment - 05/26/20 0001      Assessment   Medical Diagnosis low back pain     Referring Provider (PT) Dr. Lilia Argue                         [facility] Adult PT Treatment/Exercise - 05/26/20 0001      Lumbar Exercises: Stretches   Active Hamstring Stretch Left;3 reps;30 seconds   PNF contract/ relax of L  hamstring   Lower Trunk Rotation 5 reps;20 seconds   to the L   Other Lumbar Stretch Exercise QL stretch bil 2 x 30 seconds in R/L sidelying with arm over head      Manual Therapy   Manual Therapy Muscle Energy Technique    Muscle Energy Technique scissor techique with R hip extension and L hip flexion 1 x 10 holding 10 seconds                  PT Education - 05/26/20 1627    Education Details Reviewed HEp and seated/ standing posture. anatomy of the SIJ and effects of surroudning musculature. wearing time frame of orthotics day 1 = 1 hour, day 2 =2 hours , day 3 = 3 hours ,etc.    Person(s) Educated Patient    Methods Explanation;Verbal cues;Handout    Comprehension Verbal cues required;Verbalized understanding            PT Short Term Goals - 05/26/20 1557      PT SHORT TERM  GOAL #1   Title Pt will be I with HEP for core, posture    Period Weeks    Status Achieved      PT SHORT TERM GOAL #2   Title Pt will notice less discomfort in mid, lower back with sitting for school work, 30 min    Period Weeks    Status Achieved             PT Long Term Goals - 05/26/20 1557      PT LONG TERM GOAL #1   Title Pt will be able to get out of bed with no pain , just min stiffness in back, improved 75%    Baseline feels improved to 25%, feeling alittle more stiffness    Period Weeks    Status On-going    Target Date 06/23/20      PT LONG TERM GOAL #2   Title Pt will be able to sit as needed with min occasional back pain    Period Weeks    Status Achieved      PT LONG TERM GOAL #3   Title Pt will be able to lift, carry bookbag without increased pain    Period Weeks    Status Achieved      PT LONG TERM GOAL #4   Title Pt will be I with HEP for long term health and spine health.    Baseline independent with current HEP, progress as able.    Time 8    Period Weeks    Status On-going                 Plan - 05/26/20 1629    Clinical Impression Statement pt  is making good progress with physical therapy and is making great progress toward her goals. She does report increased soreness today which she noted she got her COVID vaccine and new orthotics yesterday. Discussed wearing frequency of orhotics to promote accomodation and reduce additional soreness. she responded well to SIJ MET techniques with focus of R hip flexor activation following hamstring stretching. She would benefit from continued physical therapy to decrease low back pain, improve posture / postural awareness, and maximize her function by addressing the deficits listed.    PT Frequency 1x / week    PT Duration 4 weeks    PT Treatment/Interventions ADLs/Self Care Home Management;Cryotherapy;Patient/family education;Taping;Therapeutic exercise;Moist Heat;Therapeutic activities;Neuromuscular re-education;Dry needling;Functional mobility training;Passive range of motion;Manual techniques    PT Next Visit Plan outcome of DN? cont PRI, address L LE LLD with LAD, SIJ for R posterior rotation, posture education, core strengthening and posterior shoulder strengthening.    PT Home Exercise Plan [id number], Lt sidelying/Rt glut max    Consulted and Agree with Plan of Care Patient           Patient will benefit from skilled therapeutic intervention in order to improve the following deficits and impairments:  Decreased activity tolerance,Decreased strength,Increased fascial restricitons,Impaired flexibility,Impaired UE functional use,Postural dysfunction,Pain,Decreased range of motion,Increased muscle spasms,Decreased mobility  Visit Diagnosis: Abnormal posture  Muscle weakness (generalized)  Chronic bilateral low back pain without sciatica     Problem List Patient Active Problem List   Diagnosis Date Noted  . COVID-19 vaccine administered 05/26/2020  . Lymph node enlargement 05/26/2020  . Family history of cardiomyopathy 04/30/2020  . Anxiety 04/30/2020  . Left knee pain 03/31/2020  .  Back pain 03/31/2020   Starr Lake PT, DPT, LAT, ATC  05/26/20  4:33 PM  [place], [place], [address] Phone: [phone]   Fax:  [phone]  Name: Euleta Belson MRN: [id number] Date of Birth: September 10, 2000     Check all possible CPT codes: Check all possible CPT codes: 97110- Therapeutic Exercise, [id number]- Neuro Re-education, [phone] - Gait Training, [phone] - Manual Therapy, 42767 - Therapeutic Activities, 01100 - Self Care, 97014 - Electrical stimulation (unattended) and 97035 - Ultrasound

## 2020-05-26 NOTE — Assessment & Plan Note (Signed)
Booster given today and observed >15 minutes without complication Instructed patient that she may experience sick symptoms and notice lymph node enlargement from vaccine which is normal

## 2020-05-27 ENCOUNTER — Ambulatory Visit (HOSPITAL_COMMUNITY)
Admission: RE | Admit: 2020-05-27 | Discharge: 2020-05-27 | Disposition: A | Discharge status: Home / Self Care | Payer: Medicaid Other | Source: Ambulatory Visit | Attending: Family Medicine | Admitting: Family Medicine

## 2020-05-27 DIAGNOSIS — I429 Cardiomyopathy, unspecified: Secondary | ICD-10-CM | POA: Insufficient documentation

## 2020-05-27 DIAGNOSIS — Z8249 Family history of ischemic heart disease and other diseases of the circulatory system: Secondary | ICD-10-CM | POA: Insufficient documentation

## 2020-05-27 LAB — ECHOCARDIOGRAM COMPLETE
Area-P 1/2: 5.02 cm2
S' Lateral: 3.3 cm

## 2020-05-27 NOTE — Progress Notes (Signed)
  Echocardiogram 2D Echocardiogram has been performed.  Diane Zuniga 05/27/2020, 2:13 PM

## 2020-05-28 DIAGNOSIS — F909 Attention-deficit hyperactivity disorder, unspecified type: Secondary | ICD-10-CM | POA: Diagnosis not present

## 2020-06-09 DIAGNOSIS — F909 Attention-deficit hyperactivity disorder, unspecified type: Secondary | ICD-10-CM | POA: Diagnosis not present

## 2020-06-10 ENCOUNTER — Other Ambulatory Visit: Payer: Self-pay

## 2020-06-10 ENCOUNTER — Ambulatory Visit: Payer: Medicaid Other

## 2020-06-10 DIAGNOSIS — M6281 Muscle weakness (generalized): Secondary | ICD-10-CM

## 2020-06-10 DIAGNOSIS — M545 Low back pain, unspecified: Secondary | ICD-10-CM | POA: Diagnosis not present

## 2020-06-10 DIAGNOSIS — G8929 Other chronic pain: Secondary | ICD-10-CM

## 2020-06-10 DIAGNOSIS — R293 Abnormal posture: Secondary | ICD-10-CM

## 2020-06-10 NOTE — Therapy (Signed)
[facility] [address] [place], [place], [address] Phone: [phone]   Fax:  [phone]  Physical Therapy Treatment  Patient Details  Name: Diane Zuniga MRN: [id number] Date of Birth: [birth year] Referring Provider (PT): Dr. Reino Bellis   Encounter Date: 06/10/2020   PT End of Session - 06/10/20 1727    Visit Number 5    Number of Visits 8    Date for PT Re-Evaluation 06/24/20    Authorization Type MCD- re-auth pending    Authorization Time Period 05/12/2020 - 06/24/2020    Authorization - Visit Number 3    Authorization - Number of Visits 3    PT Start Time 1600   patient late   PT Stop Time 1630    PT Time Calculation (min) 30 min    Activity Tolerance Patient tolerated treatment well    Behavior During Therapy [facility] for tasks assessed/performed           Past Medical History:  Diagnosis Date  . Anxiety   . Family history of cardiomyopathy   . Palpitations     History reviewed. No pertinent surgical history.  There were no vitals filed for this visit.   Subjective Assessment - 06/10/20 1558    Subjective "The exercises were working, but then the weather got bad and I feel worse, I feel like a cold rubber band. It feels like my Rt rib is poking out"    Patient Stated Goals Patient would like to improve pain and avoid using meds    Currently in Pain? Yes    Pain Score 6     Pain Location Back    Pain Orientation Right    Pain Descriptors / Indicators Aching    Pain Type Chronic pain    Pain Onset More than a month ago                             [facility] Adult PT Treatment/Exercise - 06/10/20 0001      Posture/Postural Control   Posture Comments Rt posterior rotated innominate      Lumbar Exercises: Stretches   Lower Trunk Rotation 60 seconds    Other Lumbar Stretch Exercise childs pose 60 sec    Other Lumbar Stretch Exercise Rt QL stretch 2 x 30 sec      Lumbar Exercises: Supine    Dead Bug Limitations LE only 2 x 10    Bridge with [employer] Limitations 2 x 15      Lumbar Exercises: Quadruped   Madcat/Old Horse 10 reps    Single Arm Raises Limitations 2 x 10    Other Quadruped Lumbar Exercises fire hydrant 1 x 10      Manual Therapy   Joint Mobilization LAD LLE grade III    Soft tissue mobilization STM Rt QL, lumbar paraspinals    Muscle Energy Technique muscle energy technique for Rt posterior rotated innominate                    PT Short Term Goals - 05/26/20 1557      PT SHORT TERM GOAL #1   Title Pt will be I with HEP for core, posture    Period Weeks    Status Achieved      PT SHORT TERM GOAL #2   Title Pt will notice less discomfort in mid, lower back with sitting for school work, 30 min  Period Weeks    Status Achieved             PT Long Term Goals - 05/26/20 1557      PT LONG TERM GOAL #1   Title Pt will be able to get out of bed with no pain , just min stiffness in back, improved 75%    Baseline feels improved to 25%, feeling alittle more stiffness    Period Weeks    Status On-going    Target Date 06/23/20      PT LONG TERM GOAL #2   Title Pt will be able to sit as needed with min occasional back pain    Period Weeks    Status Achieved      PT LONG TERM GOAL #3   Title Pt will be able to lift, carry bookbag without increased pain    Period Weeks    Status Achieved      PT LONG TERM GOAL #4   Title Pt will be I with HEP for long term health and spine health.    Baseline independent with current HEP, progress as able.    Time 8    Period Weeks    Status On-going                 Plan - 06/10/20 1608    Clinical Impression Statement Overall good tolerance to today's session with patient reporting feeling "looser" at the end of session. Mild Rt posterior rotated innominate present at beginning of session that corrects with muscle energy technique. Patient challenged with quadruped strengthening  exercises having difficulty maintaining pelvic stability. Though patient reported sensation of feeling Rt lower rib displacement, no palpable findings to suggest displacement noted. This sensation is likely due to lingering tautness of Rt QL and paraspinals, as patient reported a reduction in this sensation following manual therapy techniques.    PT Frequency 1x / week    PT Duration 4 weeks    PT Treatment/Interventions ADLs/Self Care Home Management;Cryotherapy;Patient/family education;Taping;Therapeutic exercise;Moist Heat;Therapeutic activities;Neuromuscular re-education;Dry needling;Functional mobility training;Passive range of motion;Manual techniques    PT Next Visit Plan SIJ for R posterior rotation, posture education, core strengthening and posterior shoulder strengthening.    PT Home Exercise Plan [id number], Lt sidelying/Rt glut max    Consulted and Agree with Plan of Care Patient           Patient will benefit from skilled therapeutic intervention in order to improve the following deficits and impairments:  Decreased activity tolerance,Decreased strength,Increased fascial restricitons,Impaired flexibility,Impaired UE functional use,Postural dysfunction,Pain,Decreased range of motion,Increased muscle spasms,Decreased mobility  Visit Diagnosis: Abnormal posture  Muscle weakness (generalized)  Chronic bilateral low back pain without sciatica     Problem List Patient Active Problem List   Diagnosis Date Noted  . COVID-19 vaccine administered 05/26/2020  . Lymph node enlargement 05/26/2020  . Family history of cardiomyopathy 04/30/2020  . Anxiety 04/30/2020  . Left knee pain 03/31/2020  . Back pain 03/31/2020   Letitia Libra, PT, DPT, ATC 06/10/20 5:44 PM  [facility] [address] [place], [place], [address] Phone: [phone]   Fax:  [phone]  Name: Diane Zuniga MRN: [id number] Date of Birth: May 31, 2000

## 2020-06-16 ENCOUNTER — Ambulatory Visit: Payer: Medicaid Other | Admitting: Physical Therapy

## 2020-06-24 ENCOUNTER — Ambulatory Visit: Payer: Medicaid Other | Attending: Sports Medicine | Admitting: Physical Therapy

## 2020-06-24 ENCOUNTER — Other Ambulatory Visit: Payer: Self-pay

## 2020-06-24 ENCOUNTER — Encounter: Payer: Self-pay | Admitting: Physical Therapy

## 2020-06-24 DIAGNOSIS — M545 Low back pain, unspecified: Secondary | ICD-10-CM | POA: Diagnosis not present

## 2020-06-24 DIAGNOSIS — M6281 Muscle weakness (generalized): Secondary | ICD-10-CM | POA: Diagnosis not present

## 2020-06-24 DIAGNOSIS — R293 Abnormal posture: Secondary | ICD-10-CM | POA: Insufficient documentation

## 2020-06-24 DIAGNOSIS — G8929 Other chronic pain: Secondary | ICD-10-CM | POA: Diagnosis not present

## 2020-06-24 NOTE — Therapy (Signed)
[facility] [address] [place], [place], [address] Phone: [phone]   Fax:  [phone]  Physical Therapy Treatment  Patient Details  Name: Diane Zuniga MRN: [id number] Date of Birth: [birth year] Referring Provider (PT): Dr. Reino Bellis   Encounter Date: 06/24/2020   PT End of Session - 06/24/20 0813    Visit Number 6    Number of Visits 8    Date for PT Re-Evaluation 06/24/20    Authorization Type MCD-4 additional treatments    Authorization Time Period 06/10/20-07/09/20    Authorization - Visit Number 2    Authorization - Number of Visits 4    PT Start Time 0805    PT Stop Time 0845    PT Time Calculation (min) 40 min           Past Medical History:  Diagnosis Date  . Anxiety   . Family history of cardiomyopathy   . Palpitations     History reviewed. No pertinent surgical history.  There were no vitals filed for this visit.   Subjective Assessment - 06/24/20 0810    Subjective I do not have a lot of time for all the exerciess but I do the core exercises.  I would like some exercises I can do standing up. I have limited space in my apartment and have trouble with having room for bird dogs. I feel like the pain is traveling up into my right neck.    Currently in Pain? Yes    Pain Score 5     Pain Location Back    Pain Orientation Right;Mid;Lower    Pain Descriptors / Indicators Aching;Sore   like it will not pop, but needs to.   Pain Type Chronic pain    Aggravating Factors  wearing a bra , weather, bookbag, washing dishes-meal prep              [facility] PT Assessment - 06/24/20 0001      AROM   Lumbar Flexion 50% tight hamstrings    Lumbar Extension WFL    Lumbar - Right Side Bend WFL    Lumbar - Left Side Bend WFL    Lumbar - Right Rotation WFL    Lumbar - Left Rotation [facility]      Strength   Right Shoulder Flexion 4+/5    Left Shoulder Flexion 4+/5      Flexibility   Hamstrings 90/90 20  right, 30 left                         [facility] Adult PT Treatment/Exercise - 06/24/20 0001      Lumbar Exercises: Stretches   Active Hamstring Stretch Limitations supine with strap    Other Lumbar Stretch Exercise open books      Lumbar Exercises: Standing   Row 20 reps    Theraband Level (Row) Level 2 (Red)    Shoulder Extension 20 reps    Theraband Level (Shoulder Extension) Level 2 (Red)    Other Standing Lumbar Exercises lower trap wall slides with lift off x 20    Other Standing Lumbar Exercises standing horizontal abduction and Er red band x 20 each      Lumbar Exercises: Supine   Other Supine Lumbar Exercises dead bug extension   reports she does not have enough room at home   Other Supine Lumbar Exercises Pilates Hundred Level 1- hooklying with head shoulder lift- 5 arm pumps.  PT Education - 06/24/20 0859    Education Details HEP    Person(s) Educated Patient    Methods Explanation;Handout    Comprehension Verbalized understanding            PT Short Term Goals - 05/26/20 1557      PT SHORT TERM GOAL #1   Title Pt will be I with HEP for core, posture    Period Weeks    Status Achieved      PT SHORT TERM GOAL #2   Title Pt will notice less discomfort in mid, lower back with sitting for school work, 30 min    Period Weeks    Status Achieved             PT Long Term Goals - 06/24/20 0906      PT LONG TERM GOAL #1   Title Pt will be able to get out of bed with no pain , just min stiffness in back, improved 75%    Baseline feels improved to 25%, feeling alittle more stiffness    Time 8    Period Weeks    Status On-going      PT LONG TERM GOAL #2   Title Pt will be able to sit as needed with min occasional back pain    Baseline improved however increases with prolonged sitting    Time 8    Period Weeks    Status Achieved      PT LONG TERM GOAL #3   Title Pt will be able to lift, carry bookbag without increased  pain    Baseline improved    Period Weeks    Status Achieved      PT LONG TERM GOAL #4   Title Pt will be I with HEP for long term health and spine health.    Baseline independent with current HEP, progress as able.    Time 8    Period Weeks    Status On-going                 Plan - 06/24/20 0865    Clinical Impression Statement Pt arrives reporting decreased pain with carrying back pack and with sitting. She reports increased pain with all standing activity like washing dishes and meal prep which is located mid/ upper back and she feels it is traveling into her neck on the right. Today she is requesting standing exercises due to lack of space and time for current HEP. Progressed with scap stabilization and worked on hamstring stretching. At end of session she reported that she did not have increased pain in back with new exercises. Updated HEP and issued therabands.    Rehab Potential Excellent    PT Frequency 1x / week    PT Duration 4 weeks    PT Treatment/Interventions ADLs/Self Care Home Management;Cryotherapy;Patient/family education;Taping;Therapeutic exercise;Moist Heat;Therapeutic activities;Neuromuscular re-education;Dry needling;Functional mobility training;Passive range of motion;Manual techniques    PT Next Visit Plan re-eval/ Cert update next visit- SIJ for R posterior rotation, posture education, core strengthening and posterior shoulder strengthening.    PT Home Exercise Plan [id number], Lt sidelying/Rt glut max, hamstring stretch, standing red band shoulder ER, horizontal abduction, shoulder extension    Consulted and Agree with Plan of Care Patient           Patient will benefit from skilled therapeutic intervention in order to improve the following deficits and impairments:  Decreased activity tolerance,Decreased strength,Increased fascial restricitons,Impaired flexibility,Impaired UE functional use,Postural dysfunction,Pain,Decreased range of motion,Increased  muscle spasms,Decreased mobility  Visit Diagnosis: Abnormal posture  Muscle weakness (generalized)  Chronic bilateral low back pain without sciatica     Problem List Patient Active Problem List   Diagnosis Date Noted  . COVID-19 vaccine administered 05/26/2020  . Lymph node enlargement 05/26/2020  . Family history of cardiomyopathy 04/30/2020  . Anxiety 04/30/2020  . Left knee pain 03/31/2020  . Back pain 03/31/2020    Sherrie Mustache, PTA 06/24/2020, 9:26 AM  [facility] [address] [place], [place], [address] Phone: [phone]   Fax:  [phone]  Name: Reneta Niehaus MRN: [id number] Date of Birth: Nov 30, 2000

## 2020-06-24 NOTE — Patient Instructions (Signed)
Access Code: [email or web address] URL: [email or web address] Date: 06/24/2020 Prepared by: Jannette Spanner  Exercises Supine Hamstring Stretch with Strap - 2 x daily - 7 x weekly - 1 sets - 3 reps - 30 hold Standing Shoulder Horizontal Abduction with Resistance - 1 x daily - 7 x weekly - 2 sets - 10 reps Standing Shoulder External Rotation with Resistance - 1 x daily - 7 x weekly - 2 sets - 10 reps Standing Resistance Shoulder Extension - 1 x daily - 7 x weekly - 2 sets - 10 reps

## 2020-06-25 DIAGNOSIS — F909 Attention-deficit hyperactivity disorder, unspecified type: Secondary | ICD-10-CM | POA: Diagnosis not present

## 2020-07-06 DIAGNOSIS — F909 Attention-deficit hyperactivity disorder, unspecified type: Secondary | ICD-10-CM | POA: Diagnosis not present

## 2020-07-09 DIAGNOSIS — F909 Attention-deficit hyperactivity disorder, unspecified type: Secondary | ICD-10-CM | POA: Diagnosis not present

## 2020-07-13 ENCOUNTER — Other Ambulatory Visit: Payer: Self-pay

## 2020-07-13 ENCOUNTER — Ambulatory Visit: Payer: Medicaid Other

## 2020-07-13 DIAGNOSIS — M6281 Muscle weakness (generalized): Secondary | ICD-10-CM

## 2020-07-13 DIAGNOSIS — R293 Abnormal posture: Secondary | ICD-10-CM

## 2020-07-13 DIAGNOSIS — M545 Low back pain, unspecified: Secondary | ICD-10-CM | POA: Diagnosis not present

## 2020-07-13 DIAGNOSIS — G8929 Other chronic pain: Secondary | ICD-10-CM | POA: Diagnosis not present

## 2020-07-13 NOTE — Therapy (Addendum)
[facility] Outpatient Rehabilitation [facility] [address] [place], [place], [address] Phone: [phone]   Fax:  [phone]  Physical Therapy Treatment/Re-certification   Patient Details  Name: Diane Zuniga MRN: [id number] Date of Birth: Feb 26, 2001 Referring Provider (PT): Dr. Reino Bellis   Encounter Date: 07/13/2020   PT End of Session - 07/13/20 1341    Visit Number 7    Number of Visits 11    Date for PT Re-Evaluation 08/14/20    Authorization Type MCD- requesting additional authorization    Authorization Time Period requesting additional authorization    Authorization - Visit Number --    Authorization - Number of Visits --    PT Start Time 1342   patient late   PT Stop Time 1415    PT Time Calculation (min) 33 min    Activity Tolerance Patient tolerated treatment well    Behavior During Therapy [facility] for tasks assessed/performed           Past Medical History:  Diagnosis Date  . Anxiety   . Family history of cardiomyopathy   . Palpitations     History reviewed. No pertinent surgical history.  There were no vitals filed for this visit.   Subjective Assessment - 07/13/20 1343    Subjective Patient reports she is doing better reports pain along posterior Lt hip and the Rt midback/ribcage. Patient reports compliance with HEP and likes the standing exercises. She reports occasional burning along the Rt side of the midback without known cause. She reports the burning sensation along the Rt side of her midback/rib cage has remained unchanged since starting PT, but feels less stiff overall and feels that her posture has improved.    How long can you sit comfortably? about 10-20 minutes    How long can you stand comfortably? about 10-20 minutes    How long can you walk comfortably? I can walk pretty far, like 30 minutes    Currently in Pain? Yes    Pain Score 3     Pain Location Rib cage    Pain Orientation Right;Mid    Pain Descriptors /  Indicators Burning;Sore    Pain Type Chronic pain    Pain Onset More than a month ago    Pain Frequency Occasional    Aggravating Factors  sitting, standing    Pain Relieving Factors nothing              [facility] PT Assessment - 07/13/20 0001      Posture/Postural Control   Posture Comments symmetrical pelvic alignment      AROM   Lumbar Flexion 50% tight hamstrings    Lumbar Extension WFL    Lumbar - Right Side Bend WFL    Lumbar - Left Side Bend WFL    Lumbar - Right Rotation WFL    Lumbar - Left Rotation WFL      Strength   Overall Strength Comments knees and hips WFL    Right Shoulder Flexion 4/5   pain along Rt scapula   Right Shoulder ABduction 4+/5    Left Shoulder Flexion 4+/5    Left Shoulder ABduction 4+/5      Flexibility   Hamstrings 90/90 20 right, 30 left      Palpation   Spinal mobility pain Rt UPAs T5-T7 and CPAs T5-T7    Palpation comment TTP and trigger points present along Rt middle trap/rhomboids      Special Tests   Other special tests (-) Long Sit  [facility] Adult PT Treatment/Exercise - 07/13/20 0001      Self-Care   Self-Care Other Self-Care Comments    Other Self-Care Comments  see patient education      Lumbar Exercises: Stretches   Passive Hamstring Stretch 60 seconds    Passive Hamstring Stretch Limitations seated      Manual Therapy   Joint Mobilization Grade II-III T5-T7    Soft tissue mobilization STM/trigger point release Rt middle trap/rhomboids                  PT Education - 07/13/20 1439    Education Details Education on overall progress and POC moving forward. Education on use of tennis ball for self soft tissue mobilization of periscpular musculature    Person(s) Educated Patient    Methods Explanation;Demonstration    Comprehension Verbalized understanding;Returned demonstration            PT Short Term Goals - 07/13/20 1401      PT SHORT TERM GOAL #1   Title Pt will  be I with HEP for core, posture    Period Weeks    Status Achieved      PT SHORT TERM GOAL #2   Title Pt will notice less discomfort in mid, lower back with sitting for school work, 30 min    Baseline reports mild Rt midback pain, though can tolerate sitting for school work.    Period Weeks    Status Achieved             PT Long Term Goals - 07/13/20 1358      PT LONG TERM GOAL #1   Title Pt will be able to get out of bed with no pain , just min stiffness in back, improved 75%    Baseline feel improved 30% improved, though still has pain    Time 8    Period Weeks    Status On-going      PT LONG TERM GOAL #2   Title Pt will be able to sit as needed with min occasional back pain    Baseline improved however increases with prolonged sitting    Time 8    Period Weeks    Status On-going      PT LONG TERM GOAL #3   Title Pt will be able to lift, carry bookbag without increased pain    Baseline able to lift and carry bookbag "as long it doesn't have a lot of stuff."    Period Weeks    Status Achieved      PT LONG TERM GOAL #4   Title Pt will be I with HEP for long term health and spine health.    Baseline independent with current HEP, progress as able.    Time 8    Period Weeks    Status On-going                 Plan - 07/13/20 1400    Clinical Impression Statement Patient's pain levels are gradually reducing since start of care with patient reporting ability to tolerate sitting for longer periods and ability to carry her back pack without increased pain. She is demonstrating gradual improvements in hip flexibility and UE strength, though has lingering hamstring tightness and shoulder weakness (Rt>Lt). Her chief complaint of pain is an intermittent burning sensation about Rt midback/ribcage that is provoked today with Rt T5-T7 CPAs and is noted to have trigger points about Rt middle trap and rhomboid musculature. She will benefit  from continued care to address above  stated deficits in order to optimize function and improve her overall tolerance to daily activity.    Rehab Potential Excellent    PT Frequency 1x / week    PT Duration 4 weeks    PT Treatment/Interventions ADLs/Self Care Home Management;Cryotherapy;Patient/family education;Taping;Therapeutic exercise;Moist Heat;Therapeutic activities;Neuromuscular re-education;Dry needling;Functional mobility training;Passive range of motion;Manual techniques    PT Next Visit Plan , core strengthening and posterior shoulder strengthening.    PT Home Exercise Plan [id number], Lt sidelying/Rt glut max, hamstring stretch, standing red band shoulder ER, horizontal abduction, shoulder extension    Consulted and Agree with Plan of Care Patient           Patient will benefit from skilled therapeutic intervention in order to improve the following deficits and impairments:  Decreased activity tolerance,Decreased strength,Increased fascial restricitons,Impaired flexibility,Impaired UE functional use,Postural dysfunction,Pain,Decreased range of motion,Increased muscle spasms,Decreased mobility  Visit Diagnosis: Abnormal posture  Muscle weakness (generalized)  Chronic bilateral low back pain without sciatica     Problem List Patient Active Problem List   Diagnosis Date Noted  . COVID-19 vaccine administered 05/26/2020  . Lymph node enlargement 05/26/2020  . Family history of cardiomyopathy 04/30/2020  . Anxiety 04/30/2020  . Left knee pain 03/31/2020  . Back pain 03/31/2020    Check all possible CPT codes: 61607- Therapeutic Exercise, [phone]- Neuro Re-education, [phone] - Manual Therapy, 97530 - Therapeutic Activities and 97535 - Self Care         Letitia Libra, PT, DPT, ATC 07/13/20 2:52 PM  [facility] Outpatient Rehabilitation [facility] [address] [place], [place], [address] Phone: [phone]   Fax:  [phone]  Name: Muranda Coye MRN: [id number] Date of Birth:  2000-06-28

## 2020-07-14 ENCOUNTER — Telehealth: Payer: Self-pay | Admitting: Physical Therapy

## 2020-07-14 NOTE — Telephone Encounter (Signed)
Spoke with pt regarding scheduling error. The treating therapist recommended scheduling 1 x week for 4 weeks and she was scheduled 1 x week for 10 weeks. She asked what if she needed more visits and it is challenging to get scheduled. I noted we can re-evaluate and determine if more treatment would be needed and schedule more at that time. Pt verbalized understanding  Dimas Scheck PT, DPT, LAT, ATC  07/14/20  11:47 AM

## 2020-07-20 ENCOUNTER — Ambulatory Visit: Payer: Medicaid Other | Attending: Sports Medicine | Admitting: Physical Therapy

## 2020-07-20 ENCOUNTER — Other Ambulatory Visit: Payer: Self-pay

## 2020-07-20 ENCOUNTER — Encounter: Payer: Self-pay | Admitting: Physical Therapy

## 2020-07-20 DIAGNOSIS — R293 Abnormal posture: Secondary | ICD-10-CM | POA: Insufficient documentation

## 2020-07-20 DIAGNOSIS — M545 Low back pain, unspecified: Secondary | ICD-10-CM | POA: Diagnosis not present

## 2020-07-20 DIAGNOSIS — G8929 Other chronic pain: Secondary | ICD-10-CM | POA: Insufficient documentation

## 2020-07-20 DIAGNOSIS — M6281 Muscle weakness (generalized): Secondary | ICD-10-CM | POA: Diagnosis not present

## 2020-07-20 NOTE — Therapy (Signed)
[facility] Outpatient Rehabilitation [facility] [address] [place], [place], [address] Phone: [phone]   Fax:  [phone]  Physical Therapy Treatment  Patient Details  Name: Diane Zuniga MRN: [id number] Date of Birth: Apr 23, 2001 Referring Provider (PT): Dr. Reino Bellis   Encounter Date: 07/20/2020   PT End of Session - 07/20/20 1538    Visit Number 8    Number of Visits 11    Date for PT Re-Evaluation 08/14/20    Authorization Type MCD- pending re-auth    Authorization Time Period requesting additional authorization    PT Start Time 1513    PT Stop Time 1545    PT Time Calculation (min) 32 min    Activity Tolerance Patient tolerated treatment well    Behavior During Therapy [facility] for tasks assessed/performed           Past Medical History:  Diagnosis Date  . Anxiety   . Family history of cardiomyopathy   . Palpitations     History reviewed. No pertinent surgical history.  There were no vitals filed for this visit.   Subjective Assessment - 07/20/20 1519    Subjective "doing better in the back and the shoulder, I notice my neck is more sore today."    Currently in Pain? Yes    Pain Score 2    neck and shoulder   Pain Orientation Right    Pain Descriptors / Indicators Aching;Sore    Pain Type Chronic pain    Pain Onset More than a month ago    Pain Frequency Intermittent    Aggravating Factors  sitting, standing              [facility] PT Assessment - 07/20/20 0001      Assessment   Medical Diagnosis low back pain     Referring Provider (PT) Dr. Reino Bellis                         [facility] Adult PT Treatment/Exercise - 07/20/20 0001      Lumbar Exercises: Aerobic   Nustep L 6 x UE/LE      Lumbar Exercises: Supine   Bent Knee Raise 15 reps   laying on foam roller   Other Supine Lumbar Exercises supine foam roll routine: ceiling punches, horizontal abd/adduciton, alternating ceiling punches, back stroke,       Lumbar Exercises: Prone   Other Prone Lumbar Exercises side plank from knees 1 x 5 bil holding 15 seconds                    PT Short Term Goals - 07/13/20 1401      PT SHORT TERM GOAL #1   Title Pt will be I with HEP for core, posture    Period Weeks    Status Achieved      PT SHORT TERM GOAL #2   Title Pt will notice less discomfort in mid, lower back with sitting for school work, 30 min    Baseline reports mild Rt midback pain, though can tolerate sitting for school work.    Period Weeks    Status Achieved             PT Long Term Goals - 07/13/20 1358      PT LONG TERM GOAL #1   Title Pt will be able to get out of bed with no pain , just min stiffness in back, improved 75%    Baseline  feel improved 30% improved, though still has pain    Time 8    Period Weeks    Status On-going      PT LONG TERM GOAL #2   Title Pt will be able to sit as needed with min occasional back pain    Baseline improved however increases with prolonged sitting    Time 8    Period Weeks    Status On-going      PT LONG TERM GOAL #3   Title Pt will be able to lift, carry bookbag without increased pain    Baseline able to lift and carry bookbag "as long it doesn't have a lot of stuff."    Period Weeks    Status Achieved      PT LONG TERM GOAL #4   Title Pt will be I with HEP for long term health and spine health.    Baseline independent with current HEP, progress as able.    Time 8    Period Weeks    Status On-going                 Plan - 07/20/20 1539    Clinical Impression Statement limited session due to pt arriving late. continued working on core strengthening which she did well but fatigues quickly. Reviewed importance of strengthening and movement to reduce pain / stiffness. End of session she reported decreased pain / stiffness.    PT Treatment/Interventions ADLs/Self Care Home Management;Cryotherapy;Patient/family education;Taping;Therapeutic exercise;Moist  Heat;Therapeutic activities;Neuromuscular re-education;Dry needling;Functional mobility training;Passive range of motion;Manual techniques    PT Next Visit Plan , core strengthening and posterior shoulder strengthening.    PT Home Exercise Plan [id number], Lt sidelying/Rt glut max, hamstring stretch, standing red band shoulder ER, horizontal abduction, shoulder extension    Consulted and Agree with Plan of Care Patient           Patient will benefit from skilled therapeutic intervention in order to improve the following deficits and impairments:  Decreased activity tolerance,Decreased strength,Increased fascial restricitons,Impaired flexibility,Impaired UE functional use,Postural dysfunction,Pain,Decreased range of motion,Increased muscle spasms,Decreased mobility  Visit Diagnosis: Abnormal posture  Muscle weakness (generalized)  Chronic bilateral low back pain without sciatica     Problem List Patient Active Problem List   Diagnosis Date Noted  . COVID-19 vaccine administered 05/26/2020  . Lymph node enlargement 05/26/2020  . Family history of cardiomyopathy 04/30/2020  . Anxiety 04/30/2020  . Left knee pain 03/31/2020  . Back pain 03/31/2020    Lulu Riding PT, DPT, LAT, ATC  07/20/20  3:51 PM      [facility] [address] [place], [place], [address] Phone: [phone]   Fax:  [phone]  Name: Diane Zuniga MRN: [id number] Date of Birth: 07/30/2000

## 2020-07-27 ENCOUNTER — Other Ambulatory Visit: Payer: Self-pay

## 2020-07-27 ENCOUNTER — Encounter: Payer: Self-pay | Admitting: Physical Therapy

## 2020-07-27 ENCOUNTER — Ambulatory Visit: Payer: Medicaid Other | Admitting: Physical Therapy

## 2020-07-27 DIAGNOSIS — R293 Abnormal posture: Secondary | ICD-10-CM | POA: Diagnosis not present

## 2020-07-27 DIAGNOSIS — G8929 Other chronic pain: Secondary | ICD-10-CM

## 2020-07-27 DIAGNOSIS — M6281 Muscle weakness (generalized): Secondary | ICD-10-CM | POA: Diagnosis not present

## 2020-07-27 DIAGNOSIS — M545 Low back pain, unspecified: Secondary | ICD-10-CM | POA: Diagnosis not present

## 2020-07-27 NOTE — Therapy (Signed)
[facility] Outpatient Rehabilitation [facility] [address] [place], [place], [address] Phone: [phone]   Fax:  [phone]  Physical Therapy Treatment  Patient Details  Name: Diane Zuniga MRN: [id number] Date of Birth: 2000-07-30 Referring Provider (PT): Dr. Reino Bellis   Encounter Date: 07/27/2020   PT End of Session - 07/27/20 1341    Visit Number 9    Number of Visits 11    Date for PT Re-Evaluation 08/14/20    Authorization Type MCD- pending re-auth    Authorization Time Period requesting additional authorization    PT Start Time 1341   pt arrived 10 min late   PT Stop Time 1411    PT Time Calculation (min) 30 min    Activity Tolerance Patient tolerated treatment well    Behavior During Therapy [facility] for tasks assessed/performed           Past Medical History:  Diagnosis Date  . Anxiety   . Family history of cardiomyopathy   . Palpitations     History reviewed. No pertinent surgical history.  There were no vitals filed for this visit.   Subjective Assessment - 07/27/20 1412    Subjective " I am feeling sore in the neck and in my side today."              [facility] PT Assessment - 07/27/20 0001      Assessment   Medical Diagnosis low back pain     Referring Provider (PT) Dr. Reino Bellis                         [facility] Adult PT Treatment/Exercise - 07/27/20 0001      Lumbar Exercises: Aerobic   Elliptical L5 x 5 min ramp L1      Lumbar Exercises: Quadruped   Plank tall plank 5 x 15 sec      Shoulder Exercises: Prone   Other Prone Exercises prone I's, T's and Y's x 2 sets   2nd set with 1# RUE only     Shoulder Exercises: Standing   Other Standing Exercises TRX rows 2 x      Shoulder Exercises: Stretch   Other Shoulder Stretches upper trap / levator scapulae stretch 2 x 30 secon   cues to avoid over stretch                 PT Education - 07/27/20 1401    Education Details Reviewed HEP/ updated  today. and discussed benefits of gentle stretching and graded exercise.    Person(s) Educated Patient    Methods Explanation;Verbal cues    Comprehension Verbalized understanding;Verbal cues required            PT Short Term Goals - 07/13/20 1401      PT SHORT TERM GOAL #1   Title Pt will be I with HEP for core, posture    Period Weeks    Status Achieved      PT SHORT TERM GOAL #2   Title Pt will notice less discomfort in mid, lower back with sitting for school work, 30 min    Baseline reports mild Rt midback pain, though can tolerate sitting for school work.    Period Weeks    Status Achieved             PT Long Term Goals - 07/13/20 1358      PT LONG TERM GOAL #1   Title Pt will be able to  get out of bed with no pain , just min stiffness in back, improved 75%    Baseline feel improved 30% improved, though still has pain    Time 8    Period Weeks    Status On-going      PT LONG TERM GOAL #2   Title Pt will be able to sit as needed with min occasional back pain    Baseline improved however increases with prolonged sitting    Time 8    Period Weeks    Status On-going      PT LONG TERM GOAL #3   Title Pt will be able to lift, carry bookbag without increased pain    Baseline able to lift and carry bookbag "as long it doesn't have a lot of stuff."    Period Weeks    Status Achieved      PT LONG TERM GOAL #4   Title Pt will be I with HEP for long term health and spine health.    Baseline independent with current HEP, progress as able.    Time 8    Period Weeks    Status On-going                 Plan - 07/27/20 1412    Clinical Impression Statement pt rpeorts soreness inthe upper trap/ QL today. discussed benefits of movement versus inactivity as well as stretching. reviewed stretching and how to perofrm at home.continued working on posterior shoulder strengthening which she did well with. updated HEP to include stretching and posterior shoulder  strengthening.    PT Treatment/Interventions ADLs/Self Care Home Management;Cryotherapy;Patient/family education;Taping;Therapeutic exercise;Moist Heat;Therapeutic activities;Neuromuscular re-education;Dry needling;Functional mobility training;Passive range of motion;Manual techniques    PT Next Visit Plan , core strengthening and posterior shoulder strengthening. try using more TRX    PT Home Exercise Plan [id number], Lt sidelying/Rt glut max, hamstring stretch, standing red band shoulder ER, horizontal abduction, shoulder extension    Consulted and Agree with Plan of Care Patient           Patient will benefit from skilled therapeutic intervention in order to improve the following deficits and impairments:  Decreased activity tolerance,Decreased strength,Increased fascial restricitons,Impaired flexibility,Impaired UE functional use,Postural dysfunction,Pain,Decreased range of motion,Increased muscle spasms,Decreased mobility  Visit Diagnosis: Abnormal posture  Muscle weakness (generalized)  Chronic bilateral low back pain without sciatica     Problem List Patient Active Problem List   Diagnosis Date Noted  . COVID-19 vaccine administered 05/26/2020  . Lymph node enlargement 05/26/2020  . Family history of cardiomyopathy 04/30/2020  . Anxiety 04/30/2020  . Left knee pain 03/31/2020  . Back pain 03/31/2020   Lulu Riding PT, DPT, LAT, ATC  07/27/20  2:17 PM      [facility] Outpatient Rehabilitation [facility] [address] [place], [place], [address] Phone: [phone]   Fax:  [phone]  Name: Aarvi Stotts MRN: [id number] Date of Birth: February 14, 2001

## 2020-08-03 ENCOUNTER — Ambulatory Visit: Payer: Medicaid Other | Admitting: Physical Therapy

## 2020-08-05 DIAGNOSIS — F909 Attention-deficit hyperactivity disorder, unspecified type: Secondary | ICD-10-CM | POA: Diagnosis not present

## 2020-08-06 DIAGNOSIS — F909 Attention-deficit hyperactivity disorder, unspecified type: Secondary | ICD-10-CM | POA: Diagnosis not present

## 2020-08-10 ENCOUNTER — Ambulatory Visit: Payer: Medicaid Other

## 2020-08-10 ENCOUNTER — Other Ambulatory Visit: Payer: Self-pay

## 2020-08-10 DIAGNOSIS — G8929 Other chronic pain: Secondary | ICD-10-CM

## 2020-08-10 DIAGNOSIS — R293 Abnormal posture: Secondary | ICD-10-CM | POA: Diagnosis not present

## 2020-08-10 DIAGNOSIS — M6281 Muscle weakness (generalized): Secondary | ICD-10-CM | POA: Diagnosis not present

## 2020-08-10 DIAGNOSIS — M545 Low back pain, unspecified: Secondary | ICD-10-CM

## 2020-08-10 NOTE — Therapy (Signed)
[place], [place], [address] Phone: [phone]   Fax:  [phone]  Physical Therapy Treatment/Discharge  Patient Details  Name: Diane Zuniga MRN: [id number] Date of Birth: 17-Dec-2000 Referring Provider (PT): Dr. Lilia Argue   Encounter Date: 08/10/2020   PT End of Session - 08/10/20 1511    Visit Number 10    Number of Visits 11    Date for PT Re-Evaluation 08/14/20    Authorization Type MCD-    Authorization Time Period 3/1-3/29/22    Authorization - Visit Number 3    Authorization - Number of Visits 4    PT Start Time 1512   patient late   PT Stop Time 1545    PT Time Calculation (min) 33 min    Activity Tolerance Patient tolerated treatment well    Behavior During Therapy [facility] for tasks assessed/performed           Past Medical History:  Diagnosis Date  . Anxiety   . Family history of cardiomyopathy   . Palpitations     History reviewed. No pertinent surgical history.  There were no vitals filed for this visit.   Subjective Assessment - 08/10/20 1514    Subjective " I am tired, I just got off work." Patient reports subjective overall improvement of 60%. She feels that she is better and is able to continue with her HEP independently. She feels sometimes her pain is stress/anxiety related.    How long can you sit comfortably? 20 minutes then the Lt side is uncomfortable.    How long can you stand comfortably? 20 minutes then my feet and hips start to bother me, but have to keep standing for work    How long can you walk comfortably? about 30 minutes    Currently in Pain? No/denies              [facility] PT Assessment - 08/10/20 0001      AROM   Lumbar Flexion 75% tight hamstrings    Lumbar Extension WFL    Lumbar - Right Side Bend WFL    Lumbar - Left Side Bend WFL    Lumbar - Right Rotation WFL    Lumbar - Left Rotation WFL      Strength   Overall Strength Comments knees  flexion/extension 5/5 bilaterally, hip flexion 5/5 bilaterally, hip extension and abduction 4+/5 bilaterally ; middle trap 4+/5 bilateral    Right Shoulder Flexion 5/5    Right Shoulder ABduction 5/5    Left Shoulder Flexion 5/5    Left Shoulder ABduction 5/5      Flexibility   Hamstrings 90/90 25 right, 30 left      Palpation   Spinal mobility soreness Rt UPAs and CPAs T5-T7    Palpation comment TTP Rt thoracic paraspinals      Special Tests   Other special tests (-) Long sit                         [facility] Adult PT Treatment/Exercise - 08/10/20 0001      Self-Care   Other Self-Care Comments  see patient education      Lumbar Exercises: Aerobic   Elliptical L5 x 5 min ramp L1      Lumbar Exercises: Standing   Other Standing Lumbar Exercises resisted hip abduction and extension 2 x 10 red theraband    Other Standing Lumbar Exercises pallof press 2 x 10 green  theraband      Lumbar Exercises: Seated   Other Seated Lumbar Exercises seated march 2 x 10                  PT Education - 08/10/20 1555    Education Details D/C education. reviewed/updated HEP. recommended f/u with PCP for anxiety if needed    Person(s) Educated Patient    Methods Explanation;Demonstration;Handout;Verbal cues    Comprehension Verbalized understanding;Returned demonstration            PT Short Term Goals - 07/13/20 1401      PT SHORT TERM GOAL #1   Title Pt will be I with HEP for core, posture    Period Weeks    Status Achieved      PT SHORT TERM GOAL #2   Title Pt will notice less discomfort in mid, lower back with sitting for school work, 30 min    Baseline reports mild Rt midback pain, though can tolerate sitting for school work.    Period Weeks    Status Achieved             PT Long Term Goals - 08/10/20 1522      PT LONG TERM GOAL #1   Title Pt will be able to get out of bed with no pain , just min stiffness in back, improved 75%    Baseline patient  reports she can get out of bed without pain.    Time 8    Period Weeks    Status Achieved      PT LONG TERM GOAL #2   Title Pt will be able to sit as needed with min occasional back pain    Baseline patient reports 2-3/10 pain after 20 minutes of sitting    Time 8    Period Weeks    Status Achieved      PT LONG TERM GOAL #3   Title Pt will be able to lift, carry bookbag without increased pain    Baseline able to lift and carry bookbag "as long it doesn't have a lot of stuff."    Period Weeks    Status Achieved      PT LONG TERM GOAL #4   Title Pt will be I with HEP for long term health and spine health.    Baseline demonstrates independence    Time 8    Period Weeks    Status Achieved                 Plan - 08/10/20 1516    Clinical Impression Statement Patient has progress well throughout duration of care with subjective improvements in overall pain and functional abilities. She demonstrates pain free trunk AROM with minor limitations remaining into flexion due to hamstring tightness as well as normalized hip and shoulder strength. She has met all established functional goals and demonstrates indepedence with home program. She is agreeable to discharge to home program at this time to further progress her strength and mobility independently. She is therefore appropriate for discharge at this time.    PT Treatment/Interventions ADLs/Self Care Home Management;Cryotherapy;Patient/family education;Taping;Therapeutic exercise;Moist Heat;Therapeutic activities;Neuromuscular re-education;Dry needling;Functional mobility training;Passive range of motion;Manual techniques    PT Next Visit Plan --    PT Home Exercise Plan [id number], Lt sidelying/Rt glut max, hamstring stretch, standing red band shoulder ER, horizontal abduction, shoulder extension    Recommended Other Services f/u with PCP regarding anxiety    Consulted and Agree with Plan of Care  Patient           Patient will  benefit from skilled therapeutic intervention in order to improve the following deficits and impairments:  Decreased activity tolerance,Decreased strength,Increased fascial restricitons,Impaired flexibility,Impaired UE functional use,Postural dysfunction,Pain,Decreased range of motion,Increased muscle spasms,Decreased mobility  Visit Diagnosis: Abnormal posture  Muscle weakness (generalized)  Chronic bilateral low back pain without sciatica     Problem List Patient Active Problem List   Diagnosis Date Noted  . COVID-19 vaccine administered 05/26/2020  . Lymph node enlargement 05/26/2020  . Family history of cardiomyopathy 04/30/2020  . Anxiety 04/30/2020  . Left knee pain 03/31/2020  . Back pain 03/31/2020  PHYSICAL THERAPY DISCHARGE SUMMARY  Visits from Start of Care: 10  Current functional level related to goals / functional outcomes: See above   Remaining deficits: See above   Education / Equipment: See above   Plan: Patient agrees to discharge.  Patient goals were met. Patient is being discharged due to meeting the stated rehab goals.  ?????        Gwendolyn Grant, PT, DPT, ATC 08/10/20 4:01 PM  [facility] [address] [place], [place], [address] Phone: [phone]   Fax:  [phone]  Name: Diane Zuniga MRN: [id number] Date of Birth: Dec 06, 2000

## 2020-08-11 DIAGNOSIS — F909 Attention-deficit hyperactivity disorder, unspecified type: Secondary | ICD-10-CM | POA: Diagnosis not present

## 2020-08-31 DIAGNOSIS — F4320 Adjustment disorder, unspecified: Secondary | ICD-10-CM | POA: Diagnosis not present

## 2020-09-02 DIAGNOSIS — F909 Attention-deficit hyperactivity disorder, unspecified type: Secondary | ICD-10-CM | POA: Diagnosis not present

## 2020-09-09 ENCOUNTER — Ambulatory Visit (INDEPENDENT_AMBULATORY_CARE_PROVIDER_SITE_OTHER): Payer: Medicaid Other | Admitting: Family Medicine

## 2020-09-09 ENCOUNTER — Other Ambulatory Visit: Payer: Self-pay

## 2020-09-09 VITALS — HR 103 | Ht 70.0 in | Wt 162.0 lb

## 2020-09-09 DIAGNOSIS — N3941 Urge incontinence: Secondary | ICD-10-CM | POA: Diagnosis not present

## 2020-09-09 DIAGNOSIS — R3915 Urgency of urination: Secondary | ICD-10-CM | POA: Diagnosis not present

## 2020-09-09 DIAGNOSIS — R350 Frequency of micturition: Secondary | ICD-10-CM | POA: Diagnosis not present

## 2020-09-09 LAB — POCT URINALYSIS DIP (MANUAL ENTRY)
Bilirubin, UA: NEGATIVE
Blood, UA: NEGATIVE
Glucose, UA: NEGATIVE mg/dL
Ketones, POC UA: NEGATIVE mg/dL
Leukocytes, UA: NEGATIVE
Nitrite, UA: NEGATIVE
Protein Ur, POC: NEGATIVE mg/dL
Spec Grav, UA: 1.025 (ref 1.010–1.025)
Urobilinogen, UA: 0.2 E.U./dL
pH, UA: 7 (ref 5.0–8.0)

## 2020-09-09 LAB — POCT URINE PREGNANCY: Preg Test, Ur: NEGATIVE

## 2020-09-09 NOTE — Assessment & Plan Note (Addendum)
Chronic with recent worsening.  Unclear etiology of urge incontinence and unusual for her age.  Urinalysis without evidence of infection, glucosuria, or hematuria/proteinuria that may be contributing.  Due to the chronicity and continued concern even at home, doubt this is behavioral in nature.  No contributing medications, would actually expect more urinary hesitancy with Strattera.  Appropriate water intake without any significant alcohol or caffeine.  Do not believe she would benefit from [facility], already on frequent voiding schedule. Will obtain BMP, assess electrolyte/renal function.  Referral to urology for further evaluation.

## 2020-09-09 NOTE — Progress Notes (Signed)
    SUBJECTIVE:   CHIEF COMPLAINT / HPI: Bladder issues   Diane Zuniga is an otherwise healthy female with a history of anxiety/ADHD presenting to discuss the following:    Urinary urgency: Reports chronic since early high school around age 20, however progress over the past few months.  This is the first time she has been evaluated this concern, she has been embarrassed that this is a problem for her.  Reports sudden urinary urgency and will have to get to the restroom as quick as possible or will have leakage in her underwear.  Previously would have a few drops of leakage, however now has had some instances where it soaks through her underwear.  Has had to change her clothes on occasion.  Does not wear any panty liner/pads as it is uncomfortable for her.  Urinary frequency, will go to the restroom every 2-3 hours and have average amount of urine voided.  No associated dysuria, polydipsia, hematuria, vaginal discharge/irritation, or abdominal pain.  Does have sensation of incomplete bladder emptying with this sometimes as well.  No loss of urine with coughing, laughing, or sneezing.  No preceding injury or trauma, occurred insidiously.  She has trouble with this in public and at home.  Regular bowel movements every other day that are soft.  Drinks a soda on a monthly basis, no coffee.  Drinks about 8 cups of water a day chronically.  She has not been sexually active before.  No known spinal cord abnormality or history of autoimmune disorder.  Only other 2 medications listed below for the past year.  Menstrual cycle monthly.   PERTINENT  PMH / PSH: ADHD on strattera for the past year, anxiety on buspar since 05/2020-- follows with behavioral health on a regular basis   OBJECTIVE:   Pulse (!) 103   Ht 5\' 10"  (1.778 m)   Wt 162 lb (73.5 kg)   LMP 09/04/2020   SpO2 98%   BMI 23.24 kg/m   General: Alert, NAD HEENT: NCAT, MMM Cardiac: RRR  Lungs: Clear bilaterally, no increased WOB on RA   Abdomen:  soft, non-tender thoughout, non-distended, normoactive BS Msk: Moves all extremities spontaneously, normal gait   Ext: Warm, dry, no edema b/l   ASSESSMENT/PLAN:   Urinary urgency Chronic with recent worsening.  Unclear etiology and unusual for her age.  Urinalysis without evidence of infection, glucosuria, or hematuria/proteinuria that may be contributing.  Due to the chronicity and continued concern even at home, doubt this is behavioral in nature.  No contributing medications, would actually expect more urinary hesitancy with Strattera.  Appropriate water intake without any significant alcohol or caffeine.  Do not believe she would benefit from [facility], already on frequent voiding schedule. Will obtain BMP, assess electrolyte/renal function.  Referral to urology for further evaluation.    Instructed to call our office if she has not heard from urology in the next 2 weeks.  Follow-up if not improving/worsening.  [facility], DO [facility]

## 2020-09-09 NOTE — Patient Instructions (Signed)
It was wonderful to see you today.  Make sure that you are staying hydrated, which can be around about (3) 16 ounce bottles daily or so.  Try to avoid drinking coffee or sodas as this might make you have the urge to go more.  You can use pantiliners as needed.  We will place a referral to urology, you should hear from someone in the next 2 weeks, if you do not please call our office to make sure that this referral has been sent.

## 2020-09-10 LAB — BASIC METABOLIC PANEL
BUN/Creatinine Ratio: 10 (ref 9–23)
BUN: 7 mg/dL (ref 6–20)
CO2: 22 mmol/L (ref 20–29)
Calcium: 9.3 mg/dL (ref 8.7–10.2)
Chloride: 104 mmol/L (ref 96–106)
Creatinine, Ser: 0.67 mg/dL (ref 0.57–1.00)
Glucose: 83 mg/dL (ref 65–99)
Potassium: 4.9 mmol/L (ref 3.5–5.2)
Sodium: 139 mmol/L (ref 134–144)
eGFR: 128 mL/min/{1.73_m2} (ref >59)

## 2020-09-12 NOTE — Progress Notes (Signed)
Subjective:   Patient ID: Diane Zuniga    DOB: 05/20/2001, 20 y.o. female   MRN: [id number]  Lam Bjorklund is a 20 y.o. female with a history of anxiety and childhood trauma, family history of cardiomyopathy, urinary urgency here for difficult hand coordination   HPI: Patient presents today with concern of bilateral hand weakness her entire life. She notes that it is difficult for her to open jars, open bags of chips, and sodas. She feels like it is gradually getting worse. She feels like sometimes she has a tremor in her hands when she holds something heavy. Denies any other muscle weakness. Denies any trauma. Denies any muscle pain. Notes that she played the trumpet in highschool in marching band without difficulty. Denies numbness/tingling.  Notes some posterior shoulder pain between her shoulder blades. Shes been to physical therapy and told it was a tight muscle. Denies any neck or shoulder trauma. She denies any family history of any muscle or autoimmune disease. Denies alcohol, tobacco, or illicit drugs. She endorses poor handwriting. She notes that she is ambidextrous but does tend to favor her right hand. Denies flick sign.   Current medications: Atomoxetine x 1 year and Buspar x a few months  PHQ-9 score: 13 GAD-7: 14  Prior Labs: BMP on 09/09/20 with normal kidneys and electrolytes    Review of Systems:  Per HPI.   Objective:   BP 112/62   Pulse 99   Wt 172 lb 9.6 oz (78.3 kg)   LMP 09/04/2020   SpO2 99%   BMI 24.77 kg/m  Vitals and nursing note reviewed.  General: pleasant young female, sitting comfortably in exam chair, well nourished, well developed, in no acute distress with non-toxic appearance HEENT: normocephalic, atraumatic, moist mucous membranes, oropharynx clear without erythema or exudate Neck: supple, normal ROM CV: regular rate and rhythm without murmurs, rubs, or gallops, 2+ radial pulses bilaterally Lungs: clear to auscultation bilaterally with  normal work of breathing on room air, speaking in full sentences Skin: warm, dry, no skin changes on hands  Extremities: warm and well perfused, normal tone MSK:  Hand/wrist: normal ROM, 5/5 strength bilaterally, no deformity, tenderness to palpation, or muscle atrophy, negative Tinels at carpel tunnel UE: normal ROM and 5/5 strength throughout bilaterally, nontenderness to palpation diffusely Neck: some tenderness along trapezius muscle bilaterally, negative Spirlings but did have some neck pain with test LE: normal ROM and 5/5 strength bilaterally, nontender to palpation Neuro: Alert and oriented, speech normal, intact cranial nerve exam, PERRL   Depression screen [facility] 2/9 09/16/2020 09/09/2020 09/09/2020  Decreased Interest 2 - 2  Down, Depressed, Hopeless 2 - 2  PHQ - 2 Score 4 - 4  Altered sleeping 1 - 2  Tired, decreased energy 2 - 3  Change in appetite 1 - 2  Feeling bad or failure about yourself  2 - 2  Trouble concentrating 2 - 2  Moving slowly or fidgety/restless 1 - 2  Suicidal thoughts 0 0 2  PHQ-9 Score 13 - 19  Difficult doing work/chores - - -   GAD 7 : Generalized Anxiety Score 09/16/2020  Nervous, Anxious, on Edge 2  Control/stop worrying 2  Worry too much - different things 2  Trouble relaxing 3  Restless 2  Easily annoyed or irritable 2  Afraid - awful might happen 1  Total GAD 7 Score 14  Anxiety Difficulty Somewhat difficult   Assessment & Plan:   Weakness of both hands Chronic. Bilateral hand weakness present throughout  her life, primarily when opening objects. Unclear etiology. No other neurologic symptoms or neuropathy. No causative medications or substance use. Overall, normal MSK and neurolgic exam throughout with normal strength and ROM of hands, UE and LE bilaterally. Tenderness along cervical spine and musculature but negative spirlings. No symptoms or exam findings consistent with carpel tunnel. No muscle pain to indicate polymyositis. Other  differentials that felt to be less likely include thyroid disease given lack of other symptoms, hypoglycemia or electrolyte abnormalities given recent normal BMP, or malignancy. She does have history of significant anxiety which was evident during exam today. Elevated GAD-7 of 14 today, currently on low dose Buspar.    Although exam is reassuring, patient appears to be very concerned. Will obtain basic labs, image of dominant hand, and refer to OT. Recommend follow up with PCP in 2-3 weeks for continued evaluation. Other work up to consider is EMG/nerve conduction studies, cervical spine imaging, and/or referral to hand specialist.   Orders Placed This Encounter  Procedures  . DG Hand Complete Right    Standing Status:   Future    Number of Occurrences:   1    Standing Expiration Date:   09/16/2021    Order Specific Question:   Reason for Exam (SYMPTOM  OR DIAGNOSIS REQUIRED)    Answer:   hand weakness    Order Specific Question:   Is patient pregnant?    Answer:   Unknown (Please Explain)    Order Specific Question:   Preferred imaging location?    Answer:   [facility]  . CBC  . Hepatitis C antibody  . HIV antibody (with reflex)  . CK  . Ambulatory referral to Occupational Therapy    Referral Priority:   Routine    Referral Type:   Occupational Therapy    Referral Reason:   Specialty Services Required    Requested Specialty:   Occupational Therapy    Number of Visits Requested:   1   No orders of the defined types were placed in this encounter.    Orpah Cobb, DO PGY-3, [facility] 09/16/2020 8:01 PM

## 2020-09-16 ENCOUNTER — Ambulatory Visit (INDEPENDENT_AMBULATORY_CARE_PROVIDER_SITE_OTHER): Payer: Medicaid Other | Admitting: Family Medicine

## 2020-09-16 ENCOUNTER — Ambulatory Visit
Admission: RE | Admit: 2020-09-16 | Discharge: 2020-09-16 | Disposition: A | Discharge status: Home / Self Care | Payer: Medicaid Other | Source: Ambulatory Visit | Attending: Family Medicine | Admitting: Family Medicine

## 2020-09-16 ENCOUNTER — Encounter: Payer: Self-pay | Admitting: Family Medicine

## 2020-09-16 ENCOUNTER — Other Ambulatory Visit: Payer: Self-pay

## 2020-09-16 VITALS — BP 112/62 | HR 99 | Wt 172.6 lb

## 2020-09-16 DIAGNOSIS — R29898 Other symptoms and signs involving the musculoskeletal system: Secondary | ICD-10-CM

## 2020-09-16 DIAGNOSIS — D509 Iron deficiency anemia, unspecified: Secondary | ICD-10-CM

## 2020-09-16 DIAGNOSIS — R531 Weakness: Secondary | ICD-10-CM | POA: Diagnosis not present

## 2020-09-16 NOTE — Assessment & Plan Note (Addendum)
Chronic. Bilateral hand weakness present throughout her life, primarily when opening objects. Unclear etiology. No other neurologic symptoms or neuropathy. No causative medications or substance use. Overall, normal MSK and neurolgic exam throughout with normal strength and ROM of hands, UE and LE bilaterally. Tenderness along cervical spine and musculature but negative spirlings. No symptoms or exam findings consistent with carpel tunnel. No muscle pain to indicate polymyositis. Other differentials that felt to be less likely include thyroid disease given lack of other symptoms, hypoglycemia or electrolyte abnormalities given recent normal BMP, or malignancy. She does have history of significant anxiety which was evident during exam today. Elevated GAD-7 of 14 today, currently on low dose Buspar.    Although exam is reassuring, patient appears to be very concerned. Will obtain basic labs, image of dominant hand, and refer to OT. Recommend follow up with PCP in 2-3 weeks for continued evaluation. Other work up to consider is EMG/nerve conduction studies, cervical spine imaging, and/or referral to hand specialist.

## 2020-09-16 NOTE — Patient Instructions (Addendum)
It was a pleasure to see you today!  Thank you for choosing [facility] for your primary care.   I did not appreciate any significant weakness in your hands, arms, shoulders, legs.  I will obtain some basic lab work to rule out some things and obtain x-ray. I have also referred you to occupational therapy to help improve your weakness.  Please be sure to follow up with your PCP within the next month for continued evaluation.   Best Wishes,   Orpah Cobb, DO

## 2020-09-17 DIAGNOSIS — F909 Attention-deficit hyperactivity disorder, unspecified type: Secondary | ICD-10-CM | POA: Diagnosis not present

## 2020-09-17 LAB — CBC
Hematocrit: 28.7 % — ABNORMAL LOW (ref 34.0–46.6)
Hemoglobin: 8 g/dL — ABNORMAL LOW (ref 11.1–15.9)
MCH: 18.7 pg — ABNORMAL LOW (ref 26.6–33.0)
MCHC: 27.9 g/dL — ABNORMAL LOW (ref 31.5–35.7)
MCV: 67 fL — ABNORMAL LOW (ref 79–97)
Platelets: 271 10*3/uL (ref 150–450)
RBC: 4.27 x10E6/uL (ref 3.77–5.28)
RDW: 19.5 % — ABNORMAL HIGH (ref 11.7–15.4)
WBC: 4.1 10*3/uL (ref 3.4–10.8)

## 2020-09-17 LAB — HIV ANTIBODY (ROUTINE TESTING W REFLEX): HIV Screen 4th Generation wRfx: NONREACTIVE

## 2020-09-17 LAB — CK: Total CK: 80 U/L (ref 32–182)

## 2020-09-17 LAB — HEPATITIS C ANTIBODY: Hep C Virus Ab: <0.1 s/co ratio (ref 0.0–0.9)

## 2020-09-17 NOTE — Addendum Note (Signed)
Addended by: Joana Reamer on: 09/17/2020 11:10 AM   Modules accepted: Orders

## 2020-09-20 ENCOUNTER — Encounter: Payer: Self-pay | Admitting: Family Medicine

## 2020-09-20 LAB — FERRITIN: Ferritin: 7 ng/mL — ABNORMAL LOW (ref 15–150)

## 2020-09-20 LAB — SPECIMEN STATUS REPORT

## 2020-09-23 ENCOUNTER — Ambulatory Visit: Payer: Medicaid Other | Attending: Sports Medicine | Admitting: Occupational Therapy

## 2020-09-23 DIAGNOSIS — M6281 Muscle weakness (generalized): Secondary | ICD-10-CM | POA: Insufficient documentation

## 2020-09-23 DIAGNOSIS — M79642 Pain in left hand: Secondary | ICD-10-CM | POA: Insufficient documentation

## 2020-09-23 DIAGNOSIS — M79641 Pain in right hand: Secondary | ICD-10-CM | POA: Insufficient documentation

## 2020-09-23 DIAGNOSIS — R278 Other lack of coordination: Secondary | ICD-10-CM | POA: Insufficient documentation

## 2020-09-23 NOTE — Therapy (Incomplete)
OUTPATIENT OCCUPATIONAL THERAPY NEURO EVALUATION  Patient Name: Diane Zuniga MRN: [id number] DOB:[birth year], 20 y.o., female Today's Date: 09/23/2020  PCP: Sabino Dick, DO REFERRING PROVIDER: Westley Chandler, MD    Past Medical History:  Diagnosis Date  . Anxiety   . Back pain 03/31/2020  . COVID-19 vaccine administered 05/26/2020  . Family history of cardiomyopathy   . Left knee pain 03/31/2020  . Palpitations    No past surgical history on file. Patient Active Problem List   Diagnosis Date Noted  . Weakness of both hands 09/16/2020  . Urinary urgency 09/09/2020  . Family history of cardiomyopathy 04/30/2020  . Anxiety 04/30/2020    REFERRING DIAG: R29.898 (ICD-10-CM) - Weakness of both hands    THERAPY DIAG:  No diagnosis found.  SUBJECTIVE:   PATIENT HISTORY: *** Chronic Bilateral hand weakness present throughout her life, primarily when opening objects. Unclear etiology. No other neurologic symptoms or neuropathy. No causative medications or substance use. Overall, normal MSK and neurolgic exam throughout with normal strength and ROM of hands, UE and LE bilaterally. Tenderness along cervical spine and musculature but negative spirlings. No symptoms or exam findings consistent with carpel tunnel. No muscle pain to indicate polymyositis. ***                                                                                                                                                                                                              PAIN: is patient experiencing pain? {Pain yes/no:25244}  PRECAUTIONS: {Therapy precautions:24002}  WEIGHT BEARING RESTRICTIONS {Yes ***/No:24003}  FALLS: Has patient fallen in last 6 months? {yes/no:20286}, Number of falls: ***  LIVING ENVIRONMENT: Lives with: {places; lives with:5711::"lives with their family":1} Lives in: {CHL Living Situation:16014002} Stairs: {yes/no:20286}; {Stairs:24000} Has following  equipment at home: {Assistive devices:23999}  PLOF: {PLOF:24004}  PATIENT GOALS ***  OBJECTIVE:   DIAGNOSTIC FINDINGS: ***  COGNITION: Overall cognitive status: {cognition:24006} Areas of impairment: {cognitive impairment:24009} Commands: {commands:24018} Attention: {intact/deficits:24005} Memory: {intact/deficits:24005} Awareness: {intact/deficits:24005} Problem solving: {intact/deficits:24005} Executive function:{Executive functioning:24008} Behavior: {behavior:24019}  ADLs: Overall ADLs: *** Eating: *** Grooming: *** UB Dressing: *** LB Dressing: *** Toileting: *** Bathing: *** Cooking: *** Home maintenance: *** Writing: Financial management: *** Medication management: *** Driving: *** Work: *** Leisure: ***  IADLs: Shopping: *** Light housekeeping: *** Meal Prep: *** Community mobility: *** Medication management: *** Financial management: ***  MOBILITY STATUS: {OTMOBILITY:25360}  WRITTEN EXPRESSION:  Dominant hand: {RIGHT/LEFT:20294} Handwriting: {OTWRITTENEXPRESSION:25361} Written experience: {OTWRITTENEXPERIENCE:25362}  VISION: Subjective report: *** Baseline vision: {OTBASELINEVISION:25363} Visual history: {OTVISUALHISTORY:25364}  VISION ASSESSMENT: Eye alignment: {  WFL-Impaired:25365} Reading acuity: {OTREADINGACUITY:25366} Acuity:  Ocular ROM: {OTOCULARROM:25367} Gaze preference/alignment: {OTGAZEPREFERENCE/ALIGNMENT:25368} Tracking/Visual pursuits: {OTTRACKING/VISUALPURSUITS:25369} Saccades: {OTSACCADES:25370} Convergence: {WFL-Impaired:25365} Visual Fields: {OTVISUALFIELDS:25371} Diplopia assessment: {OTDIPLOPIA:25373} Depth perception: ***  Patient has difficulty with following activities due to visual impairments: ***  PATIENT SURVEYS: {rehab surveys:24030}  POSTURE COMMENTS: ***  SENSATION: Light touch: {OHYWVPXT:06269} Stereognosis: {IMPAIRED:25374} Hot/Cold: {IMPAIRED:25374} Proprioception: {IMPAIRED:25374} Semmes  Weinstein Monofilament scale: {semmes weinstein monofilament scale:25375}  COORDINATION: Finger Nose Finger test: *** Heel Shin test: *** 9 Hoe Peg test: Right: *** sec; Left: *** sec Box and Blocks: Comments: ***  PERCEPTION: {WFL-Impaired:25365}  PRAXIS: {WFL-Impaired:25365}  EDEMA: ***  TONE:***  UE AROM/PROM:  A/PROM Right 09/23/2020 Left 09/23/2020  Shoulder flexion *** deg *** deg  Shoulder abduction *** deg *** deg  Shoulder adduction *** deg *** deg  Shoulder extension *** deg *** deg  Shoulder internal rotation *** deg *** deg  Shoulder external rotation *** deg *** deg  Elbow flexion *** deg *** deg  Elbow extension *** deg *** deg  Wrist flexion *** deg *** deg  Wrist extension *** deg *** deg  Wrist ulnar deviation *** deg *** deg  Wrist radial deviation *** deg *** deg  Wrist pronation *** deg *** deg  Wrist supination *** deg *** deg   HAND A/PROM:  A/PROM Right 09/23/2020 Left 09/23/2020  Thumb MCP (0-60) *** deg *** deg  Thumb IP (0-80) *** deg *** deg  Thumb Radial abd/add (0-55) *** deg *** deg  Thumb Palmar abd/add (0-45) *** deg *** deg  Thumb opposition to index *** deg *** deg  Index MCP (0-90) *** deg *** deg  Index PIP (0-100) *** deg *** deg  Index DIP (0-70) *** deg *** deg  Long MCP (0-90) *** deg *** deg  Long PIP (0-100) *** deg *** deg  Long DIP (0-70) *** deg *** deg  Ring MCP (0-90_ *** deg *** deg  Ring PIP (0-100) *** deg *** deg  Ring DIP (0-70) *** deg *** deg  Little MCP (0-90) *** deg *** deg  Little PIP (0-100) *** deg *** deg  Little DIP (0-70) *** deg *** deg    UE MMT:  MMT Right 09/23/2020 Left 09/23/2020  Shoulder flexion ***/5 ***/5  Shoulder abduction ***/5 ***/5  Shoulder adduction ***/5 ***/5  Shoulder extension ***/5 ***/5  Middle trapezius ***/5 ***/5  Lower trapezius ***/5 ***/5  Elbow flexion ***/5 ***/5  Elbow extension ***/5 ***/5  Wrist flexion ***/5 ***/5  Wrist extension ***/5 ***/5  Wrist ulnar  deviation ***/5 ***/5  Wrist radial deviation ***/5 ***/5  Wrist pronation ***/5 ***/5  Wrist supination ***/5 ***/5    HAND FUNCTION:  Grip strength: Right: *** lbs; Left: *** lbs Lateral pinch: Right: *** lbs, Left: *** lbs 3 point pinch: Right: *** lbs, Left: *** lbs  TODAY'S TREATMENT:  ***   PATIENT EDUCATION: Education details: *** Person educated: {Person educated:25204} Education method: {Education Method:25205} Education comprehension: {Education Comprehension:25206}   HOME EXERCISE PROGRAM: ***  ASSESSMENT:  CLINICAL IMPRESSION: Patient is a *** y.o. *** who was seen today for occupational therapy evaluation and treatment for ***. Patient has performance deficits in physical skills including {OT physical skills:25468}, cognitive skills including {OT cognitive skills:25469}, and psychosocial skills including {OT psychosocial skills:25470}. These impairments are limiting patient from {OT performance deficits:25471}.  Patient will benefit from skilled OT to address above impairments and improve overall function.  MODIFICATION OR ASSISTANCE TO COMPLETE EVALUATION: {OT modification:25474}  CLINICAL DECISION MAKING: {OT CDM:25475}  REHAB POTENTIAL: {  rehabpotential:25112}  EVALUATION COMPLEXITY: {Evaluation complexity:25115}     GOALS: Goals reviewed with patient? {yes/no:20286}  SHORT TERM GOALS:  STG Name Target Date Goal status  1 *** Comments:  *** {GOALSTATUS:25110}  2 *** Comments:  *** {GOALSTATUS:25110}  3 *** Comments:  *** {GOALSTATUS:25110}  4 *** Comments:  *** {GOALSTATUS:25110}  5 *** Comments:  *** {GOALSTATUS:25110}  6 *** Comments:  *** {GOALSTATUS:25110}  7 *** Comments:  *** {GOALSTATUS:25110}   LONG TERM GOALS:   LTG Name Target Date Goal status  1 *** Comments:  *** {GOALSTATUS:25110}  2 *** Comments:  *** {GOALSTATUS:25110}  3 *** Comments:  *** {GOALSTATUS:25110}  4 *** Comments:  *** {GOALSTATUS:25110}  5  *** Comments:  *** {GOALSTATUS:25110}  6 *** Comments: *** {GOALSTATUS:25110}  7 *** Comments:  *** {GOALSTATUS:25110}   PLAN: OT FREQUENCY: {rehab frequency:25116}  OT DURATION: {rehab duration:25117}  PLANNED INTERVENTIONS: {OT Interventions:25467}  PLAN FOR NEXT SESSION: ***   Junious Dresser 09/23/2020, 1:20 PM  [place] [facility] [facility] [address] [place], [place], [address] Phone: [phone]   Fax:  [phone]  Patient name: Allegra Cerniglia MRN: [id number] DOB: 2000-08-09

## 2020-09-24 DIAGNOSIS — R350 Frequency of micturition: Secondary | ICD-10-CM | POA: Diagnosis not present

## 2020-09-24 DIAGNOSIS — R351 Nocturia: Secondary | ICD-10-CM | POA: Diagnosis not present

## 2020-09-24 DIAGNOSIS — N3944 Nocturnal enuresis: Secondary | ICD-10-CM | POA: Diagnosis not present

## 2020-09-24 DIAGNOSIS — N3941 Urge incontinence: Secondary | ICD-10-CM | POA: Diagnosis not present

## 2020-09-28 DIAGNOSIS — F909 Attention-deficit hyperactivity disorder, unspecified type: Secondary | ICD-10-CM | POA: Diagnosis not present

## 2020-09-29 ENCOUNTER — Other Ambulatory Visit: Payer: Self-pay | Admitting: Family Medicine

## 2020-09-29 ENCOUNTER — Ambulatory Visit (INDEPENDENT_AMBULATORY_CARE_PROVIDER_SITE_OTHER): Payer: Medicaid Other | Admitting: Family Medicine

## 2020-09-29 ENCOUNTER — Other Ambulatory Visit: Payer: Self-pay

## 2020-09-29 ENCOUNTER — Encounter: Payer: Self-pay | Admitting: Family Medicine

## 2020-09-29 VITALS — BP 100/68 | HR 109 | Ht 70.0 in | Wt 173.2 lb

## 2020-09-29 DIAGNOSIS — D500 Iron deficiency anemia secondary to blood loss (chronic): Secondary | ICD-10-CM

## 2020-09-29 DIAGNOSIS — R42 Dizziness and giddiness: Secondary | ICD-10-CM | POA: Diagnosis not present

## 2020-09-29 DIAGNOSIS — D509 Iron deficiency anemia, unspecified: Secondary | ICD-10-CM | POA: Insufficient documentation

## 2020-09-29 DIAGNOSIS — D649 Anemia, unspecified: Secondary | ICD-10-CM | POA: Insufficient documentation

## 2020-09-29 LAB — POCT HEMOGLOBIN: Hemoglobin: 8.1 g/dL — AB (ref 11–14.6)

## 2020-09-29 NOTE — Patient Instructions (Addendum)
Call this number to schedule iron infusion  [phone]     Anemia  Anemia is a condition in which there is not enough red blood cells or hemoglobin in the blood. Hemoglobin is a substance in red blood cells that carries oxygen. When you do not have enough red blood cells or hemoglobin (are anemic), your body cannot get enough oxygen and your organs may not work properly. As a result, you may feel very tired or have other problems. What are the causes? Common causes of anemia include:  Excessive bleeding. Anemia can be caused by excessive bleeding inside or outside the body, including bleeding from the intestines or from heavy menstrual periods in females.  Poor nutrition.  Long-lasting (chronic) kidney, thyroid, and liver disease.  Bone marrow disorders, spleen problems, and blood disorders.  Cancer and treatments for cancer.  HIV (human immunodeficiency virus) and AIDS (acquired immunodeficiency syndrome).  Infections, medicines, and autoimmune disorders that destroy red blood cells. What are the signs or symptoms? Symptoms of this condition include:  Minor weakness.  Dizziness.  Headache, or difficulties concentrating and sleeping.  Heartbeats that feel irregular or faster than normal (palpitations).  Shortness of breath, especially with exercise.  Pale skin, lips, and nails, or cold hands and feet.  Indigestion and nausea. Symptoms may occur suddenly or develop slowly. If your anemia is mild, you may not have symptoms. How is this diagnosed? This condition is diagnosed based on blood tests, your medical history, and a physical exam. In some cases, a test may be needed in which cells are removed from the soft tissue inside of a bone and looked at under a microscope (bone marrow biopsy). Your health care provider may also check your stool (feces) for blood and may do additional testing to look for the cause of your bleeding. Other tests may include:  Imaging tests,  such as a CT scan or MRI.  A procedure to see inside your esophagus and stomach (endoscopy).  A procedure to see inside your colon and rectum (colonoscopy). How is this treated? Treatment for this condition depends on the cause. If you continue to lose a lot of blood, you may need to be treated at a hospital. Treatment may include:  Taking supplements of iron, vitamin A41, or folic acid.  Taking a hormone medicine (erythropoietin) that can help to stimulate red blood cell growth.  Having a blood transfusion. This may be needed if you lose a lot of blood.  Making changes to your diet.  Having surgery to remove your spleen. Follow these instructions at home:  Take over-the-counter and prescription medicines only as told by your health care provider.  Take supplements only as told by your health care provider.  Follow any diet instructions that you were given by your health care provider.  Keep all follow-up visits as told by your health care provider. This is important. Contact a health care provider if:  You develop new bleeding anywhere in the body. Get help right away if:  You are very weak.  You are short of breath.  You have pain in your abdomen or chest.  You are dizzy or feel faint.  You have trouble concentrating.  You have bloody stools, black stools, or tarry stools.  You vomit repeatedly or you vomit up blood. These symptoms may represent a serious problem that is an emergency. Do not wait to see if the symptoms will go away. Get medical help right away. Call your local emergency services (911 in the  [place] Do not drive yourself to the hospital. Summary  Anemia is a condition in which you do not have enough red blood cells or enough of a substance in your red blood cells that carries oxygen (hemoglobin).  Symptoms may occur suddenly or develop slowly.  If your anemia is mild, you may not have symptoms.  This condition is diagnosed with blood tests, a  medical history, and a physical exam. Other tests may be needed.  Treatment for this condition depends on the cause of the anemia. This information is not intended to replace advice given to you by your health care provider. Make sure you discuss any questions you have with your health care provider. Document Revised: 04/15/2019 Document Reviewed: 04/15/2019 Elsevier Patient Education  2021 [employer].

## 2020-09-29 NOTE — Progress Notes (Signed)
    SUBJECTIVE:   CHIEF COMPLAINT / HPI:   Anemia Patient presenting today after not going to school due to feeling dizzy and out of breath.  Patient has a history of heavy menstrual cycles and reports changing her pads every 2 hours.  She has been seen for this previously and has been taking iron daily since her last visit.  She denies any constipation.  She last started her cycle yesterday and reports heavy bleeding.  This is very typical for her. She has no history of blood transfusions. No family hx of sickle cell. Mom has history of iron deficiency anemia. Mom passed away with cardiomyopathy at 51 years old. She has had a work up with cardiologist and echocardiogram.   PERTINENT  PMH / PSH: menorrhagia, anemia   OBJECTIVE:   BP 100/68   Pulse (!) 109   Ht 5\' 10"  (1.778 m)   Wt 173 lb 3.2 oz (78.6 kg)   LMP 09/28/2020 (Exact Date)   SpO2 98%   BMI 24.85 kg/m   General: well appearing female, no acute distress.  Speaking in full sentences Chest: Regular rate and rhythm.  No murmurs appreciated Lungs: No respiratory distress.  Lungs clear to auscultation lungs clear to auscultation bilaterally No lower extremity edema   ASSESSMENT/PLAN:   Anemia Given patient's symptoms, will recommend iron infusion.  Those orders have been placed.  She should continue taking her oral iron.  Hemglobin is 8.1. Last 8.0. She is otherwise hemodynamically stable and no concerning findings on physical exam.  Patient should also consider hormonal contraception which we discussed today.  She will think about it and will discuss at the next exam. She was supposed to follow-up on June 15, however, I have suggested that she be seen before her next menstrual cycle so that we can obtain labs at that time (around June 1 through 6).  Also sending out hemoglobin electrophoresis and B12 to investigate other causes of anemia.  She denies any other bleeding. Return precautions provided.  Emergency department  precautions provided.    10-18-1969, MD [facility]

## 2020-09-29 NOTE — Assessment & Plan Note (Addendum)
Given patient's symptoms, will recommend iron infusion.  Those orders have been placed.  She should continue taking her oral iron.  Hemglobin is 8.1. Last 8.0. She is otherwise hemodynamically stable and no concerning findings on physical exam.  Patient should also consider hormonal contraception which we discussed today.  She will think about it and will discuss at the next exam. She was supposed to follow-up on June 15, however, I have suggested that she be seen before her next menstrual cycle so that we can obtain labs at that time (around June 1 through 6).  Also sending out hemoglobin electrophoresis and B12 to investigate other causes of anemia.  She denies any other bleeding. Return precautions provided.  Emergency department precautions provided.

## 2020-09-29 NOTE — Progress Notes (Signed)
Orders for iron transfusion. RN to release orders.

## 2020-09-30 ENCOUNTER — Ambulatory Visit: Payer: Medicaid Other | Admitting: Occupational Therapy

## 2020-09-30 DIAGNOSIS — M79642 Pain in left hand: Secondary | ICD-10-CM | POA: Diagnosis not present

## 2020-09-30 DIAGNOSIS — R278 Other lack of coordination: Secondary | ICD-10-CM | POA: Diagnosis not present

## 2020-09-30 DIAGNOSIS — M6281 Muscle weakness (generalized): Secondary | ICD-10-CM

## 2020-09-30 DIAGNOSIS — M79641 Pain in right hand: Secondary | ICD-10-CM | POA: Diagnosis not present

## 2020-09-30 NOTE — Therapy (Addendum)
OUTPATIENT OCCUPATIONAL THERAPY NEURO EVALUATION  Patient Name: Diane Zuniga MRN: [id number] DOB:Aug 19, 2000, 20 y.o., female Today's Date: 09/30/2020  PCP: Sabino Dick, DO REFERRING PROVIDER: Terisa Starr, MD     OT End of Session - 09/30/20 1800    Visit Number 1    Number of Visits 5    Date for OT Re-Evaluation 10/28/20    Authorization Type Medicaid [employer]    Authorization Time Period $3 copay    Authorization - Number of Visits 5    OT Start Time 1721    OT Stop Time 1801    OT Time Calculation (min) 40 min    Activity Tolerance Patient tolerated treatment well    Behavior During Therapy WFL for tasks assessed/performed;Flat affect            Past Medical History:  Diagnosis Date  . Anxiety   . Back pain 03/31/2020  . COVID-19 vaccine administered 05/26/2020  . Family history of cardiomyopathy   . Left knee pain 03/31/2020  . Palpitations    No past surgical history on file. Patient Active Problem List   Diagnosis Date Noted  . Anemia 09/29/2020  . Weakness of both hands 09/16/2020  . Urinary urgency 09/09/2020  . Family history of cardiomyopathy 04/30/2020  . Anxiety 04/30/2020    REFERRING DIAG: R29.898 (ICD-10-CM) - Weakness of both hands    THERAPY DIAG:  Muscle weakness (generalized) - Plan: Ot plan of care cert/re-cert  Other lack of coordination - Plan: Ot plan of care cert/re-cert  Pain in left hand - Plan: Ot plan of care cert/re-cert  Pain in right hand - Plan: Ot plan of care cert/re-cert  SUBJECTIVE:   "I would just like to be able to open jars without help or bags of chips."  PATIENT HISTORY: Pt is a 20 year old that presents to OPOT with Chronic Bilateral hand weakness present throughout her life, primarily when opening objects. Unclear etiology. No other neurologic symptoms or neuropathy. No causative medications or substance use. Overall, normal musculoskeletal and neurolgic exam throughout with normal strength and  ROM of hands, UE and LE bilaterally. Tenderness along cervical spine and musculature but negative spirlings. No symptoms or exam findings consistent with carpel tunnel. No muscle pain to indicate polymyositis.                                                                                                                                                                                                               PAIN:  Are you having  pain? Yes VAS scale: 2/10 Pain location: thumbs (bilateral - R>L) Pain orientation: Bilateral  PAIN TYPE: sore Pain description: constant  Aggravating factors: handwriting for a long time Relieving factors: pt reports nothing makes it better  PRECAUTIONS: None  WEIGHT BEARING RESTRICTIONS No  FALLS: Has patient fallen in last 6 months? No, Number of falls: N/A  LIVING ENVIRONMENT: Lives with: places; lives with: lives with a roommate but do not interact Lives in: House/apartment  PLOF: Independent  PATIENT GOALS "to be able to open jars without assistance and open bags of chips"  OBJECTIVE:   DIAGNOSTIC FINDINGS: There is no evidence of fracture or dislocation. There is no evidence of arthropathy or other focal bone abnormality. Soft tissues are unremarkable.   COGNITION: Overall cognitive status: Difficulty to assess. Flat Affect. Anxious.   ADLs: Overall ADLs: Pt is independent with all ADLs. Pt reports difficulty with texting and handwriting. PT reports difficulty with opening containers (jars, bags of chips, using chopsticks)   IADLs: Shopping: Independent - harder to pick up heavier grocery bags Light housekeeping: independent Meal Prep: Cooking can be hard - chopping vegetables Community mobility: drives own vehicle Medication management: difficulty opening containers sometimes. Financial management: handles them independently   WRITTEN EXPRESSION:  Dominant hand: right Handwriting: Increased time - pt reports it is better when  she takes her time but her hand gets tired. Pt demonstrates a light pressured tripod grasp on utensil.   VISION: Subjective report: Pt wears glasses all the time. Baseline vision: Wears glasses all the time   PATIENT SURVEYS:  UEFS 70/80 Quick Dash 18% deficit/difficulty Functional Assessment Questionnaire: 57/64 (higher score indicates "normal", lower score indicates more difficulty)   SENSATION: Light touch: Appears intact Stereognosis: Appears intact Hot/Cold: Appears intact Proprioception: Appears intact Pt reports "sometimes it can feel overwhelming"  COORDINATION: 9 Hole Peg test: Right: 20.03s sec; Left: 21.19s sec Box and Blocks: R 50, L 54    ROM: WFL BUE  HAND A/PROM: composite flexion/extension 100% - hypermobility in BUE    STRENGTH WFL BUE  HAND FUNCTION:  Grip strength: Right: 46.9 lbs; Left: 40.1 lbs Lateral pinch: Right: 12.5 lbs, Left: 13 lbs 3 point pinch: Right: 12 lbs, Left: 11 lbs Tip pinch: Right: 9 lbs, Left: 9 lbs   TODAY'S TREATMENT:  Education on role and purpose of OT. Assessed and UE function.   PATIENT EDUCATION: Education details: Education provided on purpose and role of OT Person educated: Patient Education method: Explanation Education comprehension: verbalized understanding   HOME EXERCISE PROGRAM: None given at evaluation  ASSESSMENT:  CLINICAL IMPRESSION: Patient is a 20 y.o.  who was seen today for occupational therapy evaluation and treatment for bilateral chronic hand weakness. Patient has performance deficits in functional skills including IADLs, dexterity, strength, pain, FMC, decreased knowledge of use of DME and UE functional use, cognitive skills including temperament/personality, and psychosocial skills including environmental adaptation. These impairments are limiting patient from ADLs, IADLs and social participation. Patient has no other co-morbidities that affects occupational performance. Patient will  benefit from skilled OT to address above impairments and improve overall function.  MODIFICATION OR ASSISTANCE TO COMPLETE EVALUATION: No modification of tasks or assist necessary to complete an evaluation.  OT OCCUPATIONAL PROFILE AND HISTORY: Problem focused assessment: Including review of records relating to presenting problem.  CLINICAL DECISION MAKING: LOW - limited treatment options, no task modification necessary  REHAB POTENTIAL: Good  EVALUATION COMPLEXITY: Low     GOALS: Goals reviewed with patient? No  LONG  TERM GOALS: (ONLY) - 4 weeks  LTG Name Target Date Goal status  1 Pt will be independent with HEP targeting maintaining and possibly increasing grip strength Comments: grip strength R 46.9 lbs, L 40.1 lbs 10/28/2020  INITIAL  2 Pt will verbalize understanding of adapted strategies and/or equipment PRN to increase safety and independence with ADLs and IADLs (I.e. handwriting, opening jars, chopsticks, opening bag of chips) Comments: pt reports difficulty with the listed activities.  INITIAL  3 Pt will improve score of UEFS by 2 pts or greater to indicate increased ease and decrease of difficulty with daily activities and tasks.  Comments: 70/80  INITIAL  4 Pt will write a paragraph of 3 or more sentences with report of decrease in fatigue and soreness. Comments: reports significant fatigue and soreness with handwriting  INITIAL  5 Pt will report pain of 1/10 or less in BUE hands/thumbs with HEP and during functional activities Comments: 2/10 reported pain/soreness  INITIAL    PLAN: OT FREQUENCY: 1x/week  OT DURATION: 4 weeks  PLANNED INTERVENTIONS: self care/ADL training, therapeutic exercise, therapeutic activity, manual therapy, splinting, ultrasound, paraffin, fluidotherapy, traction, moist heat, cryotherapy, patient/family education, psychosocial skills training and DME and/or AE instructions  PLAN FOR NEXT SESSION:  Fluidotherapy, trial different pencil grips  for comfort with writing, HEP for grip strengthening  RECOMMENDED OTHER SERVICES: N/A  CONSULTED AND AGREED WITH PLAN OF CARE: Patient   Managed medicaid CPT codes: 59935- Therapeutic Exercise, 97140 - Manual Therapy, 97530 - Therapeutic Activities, 97535 - Self Care, [phone] - Mechanical traction, [id number] - Ultrasound, [phone] - Orthotic Fit, [id number] - Fluidotherapy and [id number] -  Paraffin       Junious Dresser MOT, OTR/L  09/30/2020, 6:22 PM  [facility] [address] [place], [place], [address] Phone: [phone]   Fax:  [phone]  Patient name: Diane Zuniga MRN: [id number] DOB: Jan 03, 2001

## 2020-10-01 ENCOUNTER — Other Ambulatory Visit: Payer: Self-pay

## 2020-10-01 ENCOUNTER — Ambulatory Visit (HOSPITAL_COMMUNITY)
Admission: RE | Admit: 2020-10-01 | Discharge: 2020-10-01 | Disposition: A | Discharge status: Home / Self Care | Payer: Medicaid Other | Source: Ambulatory Visit | Attending: Family Medicine | Admitting: Family Medicine

## 2020-10-01 DIAGNOSIS — D500 Iron deficiency anemia secondary to blood loss (chronic): Secondary | ICD-10-CM | POA: Diagnosis not present

## 2020-10-01 LAB — HGB FRACTIONATION CASCADE
Hgb A2: 2 % (ref 1.8–3.2)
Hgb A: 98 % (ref 96.4–98.8)
Hgb F: 0 % (ref 0.0–2.0)
Hgb S: 0 %

## 2020-10-01 LAB — VITAMIN B12: Vitamin B-12: 811 pg/mL (ref 232–1245)

## 2020-10-01 MED ORDER — SODIUM CHLORIDE 0.9 % IV SOLN
510.0000 mg | Freq: Once | INTRAVENOUS | Status: AC
  Administered 2020-10-01: 510 mg via INTRAVENOUS
  Filled 2020-10-01: qty 17

## 2020-10-01 NOTE — Discharge Instructions (Signed)
Ferumoxytol injection What is this medicine? FERUMOXYTOL is an iron complex. Iron is used to make healthy red blood cells, which carry oxygen and nutrients throughout the body. This medicine is used to treat iron deficiency anemia. This medicine may be used for other purposes; ask your health care provider or pharmacist if you have questions. COMMON BRAND NAME(S): Feraheme What should I tell my health care provider before I take this medicine? They need to know if you have any of these conditions:  anemia not caused by low iron levels  high levels of iron in the blood  magnetic resonance imaging (MRI) test scheduled  an unusual or allergic reaction to iron, other medicines, foods, dyes, or preservatives  pregnant or trying to get pregnant  breast-feeding How should I use this medicine? This medicine is for injection into a vein. It is given by a health care professional in a hospital or clinic setting. Talk to your pediatrician regarding the use of this medicine in children. Special care may be needed. Overdosage: If you think you have taken too much of this medicine contact a poison control center or emergency room at once. NOTE: This medicine is only for you. Do not share this medicine with others. What if I miss a dose? It is important not to miss your dose. Call your doctor or health care professional if you are unable to keep an appointment. What may interact with this medicine? This medicine may interact with the following medications:  other iron products This list may not describe all possible interactions. Give your health care provider a list of all the medicines, herbs, non-prescription drugs, or dietary supplements you use. Also tell them if you smoke, drink alcohol, or use illegal drugs. Some items may interact with your medicine. What should I watch for while using this medicine? Visit your doctor or healthcare professional regularly. Tell your doctor or healthcare  professional if your symptoms do not start to get better or if they get worse. You may need blood work done while you are taking this medicine. You may need to follow a special diet. Talk to your doctor. Foods that contain iron include: whole grains/cereals, dried fruits, beans, or peas, leafy green vegetables, and organ meats (liver, kidney). What side effects may I notice from receiving this medicine? Side effects that you should report to your doctor or health care professional as soon as possible:  allergic reactions like skin rash, itching or hives, swelling of the face, lips, or tongue  breathing problems  changes in blood pressure  feeling faint or lightheaded, falls  fever or chills  flushing, sweating, or hot feelings  swelling of the ankles or feet Side effects that usually do not require medical attention (report to your doctor or health care professional if they continue or are bothersome):  diarrhea  headache  nausea, vomiting  stomach pain This list may not describe all possible side effects. Call your doctor for medical advice about side effects. You may report side effects to FDA at 1-800-FDA-1088. Where should I keep my medicine? This drug is given in a hospital or clinic and will not be stored at home. NOTE: This sheet is a summary. It may not cover all possible information. If you have questions about this medicine, talk to your doctor, pharmacist, or health care provider.  2021 Elsevier/Gold Standard (2016-06-26 20:21:10)  

## 2020-10-05 DIAGNOSIS — F909 Attention-deficit hyperactivity disorder, unspecified type: Secondary | ICD-10-CM | POA: Diagnosis not present

## 2020-10-07 ENCOUNTER — Other Ambulatory Visit: Payer: Self-pay

## 2020-10-07 ENCOUNTER — Ambulatory Visit: Payer: Medicaid Other | Admitting: Occupational Therapy

## 2020-10-07 DIAGNOSIS — R278 Other lack of coordination: Secondary | ICD-10-CM

## 2020-10-07 DIAGNOSIS — M79641 Pain in right hand: Secondary | ICD-10-CM | POA: Diagnosis not present

## 2020-10-07 DIAGNOSIS — M6281 Muscle weakness (generalized): Secondary | ICD-10-CM | POA: Diagnosis not present

## 2020-10-07 DIAGNOSIS — M79642 Pain in left hand: Secondary | ICD-10-CM | POA: Diagnosis not present

## 2020-10-07 NOTE — Therapy (Signed)
OUTPATIENT OCCUPATIONAL THERAPY TREATMENT NOTE   Patient Name: Diane Zuniga MRN: [id number] DOB:02/17/2001, 20 y.o., female Today's Date: 10/07/2020  PCP: Sabino Dick, DO REFERRING PROVIDER: Terisa Starr MD      Past Medical History:  Diagnosis Date  . Anxiety   . Back pain 03/31/2020  . COVID-19 vaccine administered 05/26/2020  . Family history of cardiomyopathy   . Left knee pain 03/31/2020  . Palpitations    No past surgical history on file. Patient Active Problem List   Diagnosis Date Noted  . Anemia 09/29/2020  . Weakness of both hands 09/16/2020  . Urinary urgency 09/09/2020  . Family history of cardiomyopathy 04/30/2020  . Anxiety 04/30/2020    REFERRING DIAG: R29.898 (ICD-10-CM) - Weakness of both hands   THERAPY DIAG:  Muscle weakness (generalized)  Other lack of coordination  Pain in left hand  Pain in right hand  SUBJECTIVE: "I brought in something that I have difficulty with opening" Pt brought in a small container of glitter that she has difficulty opening.   PAIN:  Are you having pain? Yes VAS scale: 2-3/10 Pain location: Web space of thumbs BUE Pain orientation: Bilateral  PAIN TYPE: aching Pain description: constant  Aggravating factors: using a phone, grating cheese Relieving factors: nothing    OBJECTIVE:     ---------------------------------------------   TODAY'S TREATMENT:  Containers pt brought in a container that she has difficulty with and we went over techniques and strategies for opening container (I.e. shelf liner). Pt reported greater ease with opening container with the shelf liner.   Fluidotherapy x 10 minutes for BUE to address pain and stiffness. No adverse reactions. Pt reports it felt less stiff and feels better.   Pencil Grips trialed different grips - issued brown foam grip for writing for patient.   ---------------------------------------------     PATIENT EDUCATION: Education details:  Reviewed goals - patient in agreement. Issued Yellow Theraputty for grip strengthening. See above. Person educated: Patient Education method: Explanation, demonstration, handout Education comprehension: verbalized understanding, demonstrated understanding   HOME EXERCISE PROGRAM: Theraputty - yellow  Access Code: [email or web address] URL: [email or web address] Date: 10/07/2020 Prepared by: Kallie Edward  Exercises Putty Squeezes - 1 x daily - 7 x weekly - 3 sets - 10 reps Rolling Putty on Table - 1 x daily - 7 x weekly - 3 sets - 10 reps Thumb Opposition with Putty - 1 x daily - 7 x weekly - 3 sets - 10 reps Finger Extension with Putty - 1 x daily - 7 x weekly - 3 sets - 10 reps   ASSESSMENT:  CLINICAL IMPRESSION: Pt in agreement to set goals from evaluation. Pt is progressing towards goals.   MODIFICATION OR ASSISTANCE TO COMPLETE EVALUATION: No modification of tasks or assist necessary to complete an evaluation.  OT OCCUPATIONAL PROFILE AND HISTORY: Problem focused assessment: Including review of records relating to presenting problem.  CLINICAL DECISION MAKING: LOW - limited treatment options, no task modification necessary  REHAB POTENTIAL: Good  EVALUATION COMPLEXITY: Low    GOALS: Goals reviewed with patient? Yes  LONG TERM GOALS: (ONLY) - 4 weeks  LTG Name Target Date Goal status  1 Pt will be independent with HEP targeting maintaining and possibly increasing grip strength Comments: grip strength R 46.9 lbs, L 40.1 lbs 10/28/2020  INITIAL  2 Pt will verbalize understanding of adapted strategies and/or equipment PRN to increase safety and independence with ADLs and IADLs (I.e. handwriting, opening jars, chopsticks, opening bag of chips) Comments: pt reports difficulty  with the listed activities.  INITIAL  3 Pt will improve score of UEFS by 2 pts or greater to indicate increased ease and decrease of difficulty with daily activities and tasks.   Comments: 70/80  INITIAL  4 Pt will write a paragraph of 3 or more sentences with report of decrease in fatigue and soreness. Comments: reports significant fatigue and soreness with handwriting  INITIAL  5 Pt will report pain of 1/10 or less in BUE hands/thumbs with HEP and during functional activities Comments: 2/10 reported pain/soreness  INITIAL    PLAN: OT FREQUENCY: 1x/week  OT DURATION: 4 weeks  PLANNED INTERVENTIONS: self care/ADL training, therapeutic exercise, therapeutic activity, manual therapy, splinting, ultrasound, paraffin, fluidotherapy, traction, moist heat, cryotherapy, patient/family education, psychosocial skills training and DME and/or AE instructions  PLAN FOR NEXT SESSION:  Fluido, see how theraputty is, handwriting, possible splints/braces?  RECOMMENDED OTHER SERVICES: N/A  CONSULTED AND AGREED WITH PLAN OF CARE: Patient   Managed medicaid CPT codes: 82505- Therapeutic Exercise, 97140 - Manual Therapy, 97530 - Therapeutic Activities, 97535 - Self Care, [phone] - Mechanical traction, 97035 - Ultrasound, [phone] - Orthotic Fit, [id number] - Fluidotherapy and [phone] -  Paraffin      Junious Dresser MOT, OTR/L  10/07/2020, 6:16 PM    [facility] [address] [place], [place], [address] Phone: [phone]   Fax:  [phone]  Patient name: Diane Zuniga MRN: [id number] DOB: 2000-08-03

## 2020-10-14 ENCOUNTER — Other Ambulatory Visit: Payer: Self-pay

## 2020-10-14 ENCOUNTER — Encounter: Payer: Self-pay | Admitting: Occupational Therapy

## 2020-10-14 ENCOUNTER — Ambulatory Visit: Payer: Medicaid Other | Admitting: Occupational Therapy

## 2020-10-14 DIAGNOSIS — R278 Other lack of coordination: Secondary | ICD-10-CM

## 2020-10-14 DIAGNOSIS — M6281 Muscle weakness (generalized): Secondary | ICD-10-CM

## 2020-10-14 DIAGNOSIS — M79642 Pain in left hand: Secondary | ICD-10-CM

## 2020-10-14 DIAGNOSIS — M79641 Pain in right hand: Secondary | ICD-10-CM

## 2020-10-14 NOTE — Therapy (Deleted)
[facility] [address] [place], [place], [address] Phone: [phone]   Fax:  [phone]  Occupational Therapy Treatment  Patient Details  Name: Diane Zuniga MRN: [id number] Date of Birth: [birth year] No data recorded  Encounter Date: 10/14/2020   OT End of Session - 10/14/20 1748    Visit Number 3    Number of Visits 5    Date for OT Re-Evaluation 10/28/20    Authorization Type Medicaid [employer]    Authorization Time Period $3 copay    Authorization - Visit Number 2    Authorization - Number of Visits 4    OT Start Time 1748    OT Stop Time 1830    OT Time Calculation (min) 42 min    Activity Tolerance Patient tolerated treatment well    Behavior During Therapy [facility] for tasks assessed/performed;Flat affect           Past Medical History:  Diagnosis Date  . Anxiety   . Back pain 03/31/2020  . COVID-19 vaccine administered 05/26/2020  . Family history of cardiomyopathy   . Left knee pain 03/31/2020  . Palpitations     No past surgical history on file.  There were no vitals filed for this visit.                                      Patient will benefit from skilled therapeutic intervention in order to improve the following deficits and impairments:           Visit Diagnosis: No diagnosis found.    Problem List Patient Active Problem List   Diagnosis Date Noted  . Anemia 09/29/2020  . Weakness of both hands 09/16/2020  . Urinary urgency 09/09/2020  . Family history of cardiomyopathy 04/30/2020  . Anxiety 04/30/2020    Diane Zuniga 10/14/2020, 5:48 PM  [place] [facility] [address] [place], [place], [address] Phone: [phone]   Fax:  [phone]  Name: Diane Zuniga MRN: [id number] Date of Birth: June 17, 2000

## 2020-10-14 NOTE — Therapy (Signed)
OUTPATIENT OCCUPATIONAL THERAPY TREATMENT NOTE   Patient Name: Diane Zuniga MRN: [id number] DOB:2001-04-01, 20 y.o., female Today's Date: 10/14/2020  PCP: Sabino Dick, DO REFERRING PROVIDER: Terisa Starr MD      OT End of Session - 10/14/20 1748    Visit Number 3    Number of Visits 5    Date for OT Re-Evaluation 10/28/20    Authorization Type Medicaid [employer]    Authorization Time Period $3 copay    Authorization - Visit Number 2    Authorization - Number of Visits 4    OT Start Time 1748    OT Stop Time 1830    OT Time Calculation (min) 42 min    Activity Tolerance Patient tolerated treatment well    Behavior During Therapy WFL for tasks assessed/performed;Flat affect           Past Medical History:  Diagnosis Date  . Anxiety   . Back pain 03/31/2020  . COVID-19 vaccine administered 05/26/2020  . Family history of cardiomyopathy   . Left knee pain 03/31/2020  . Palpitations    No past surgical history on file. Patient Active Problem List   Diagnosis Date Noted  . Anemia 09/29/2020  . Weakness of both hands 09/16/2020  . Urinary urgency 09/09/2020  . Family history of cardiomyopathy 04/30/2020  . 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 No VAS scale: 2/10 Pain location: Web space of thumbs BUE Pain orientation: Bilateral  PAIN TYPE: aching Pain description: constant  Aggravating factors: using a phone, grating cheese Relieving factors: nothing    OBJECTIVE:       TODAY'S TREATMENT:  Ultrasound 0.8 w/cm2, 8 minutes, continuous, x BUE volar web space  Golf Balls manipulating in hand bilateral 5 revolutions  AROM for  thumb radial and palmar abduction x 10 reps        PATIENT EDUCATION: Education details: AROM thumb radial and palmar abduction x 10 reps  Person educated: Patient Education method: demonstration and verbal Education comprehension: demonstrated and verbalized understanding   HOME EXERCISE PROGRAM: See pt instructions. Palmar Adduction/Abduction (Active)    Move thumb down, away from palm. Move back to rest along palm. Repeat ____ times. Do ____ sessions per day.  Radial Adduction/Abduction (Active)    Move thumb out to side. Move back alongside index finger. Repeat ____ times. Do ____ sessions per day.  Copyright  VHI. All rights reserved.      ASSESSMENT:  CLINICAL IMPRESSION: Pt is progressing towards goals.   MODIFICATION OR ASSISTANCE TO COMPLETE EVALUATION: No modification of tasks or assist necessary to complete an evaluation.  OT OCCUPATIONAL PROFILE AND HISTORY: Problem focused assessment: Including review of records relating to presenting problem.  CLINICAL DECISION MAKING: LOW - limited treatment options, no task modification necessary  REHAB POTENTIAL: Good  EVALUATION COMPLEXITY: Low    GOALS: Goals reviewed with patient? Yes  LONG TERM GOALS: (ONLY) - 4 weeks  LTG Name Target Date Goal status  1 Pt will be independent with HEP targeting maintaining and possibly increasing grip strength Comments: grip strength R 46.9 lbs, L 40.1 lbs 10/28/2020  ONGOING  2  Pt will verbalize understanding of adapted strategies and/or equipment PRN to increase safety and independence with ADLs and IADLs (I.e. handwriting, opening jars, chopsticks, opening bag of chips) Comments: pt reports difficulty with the listed activities.  ONGOING  3 Pt will improve score of UEFS by 2 pts or greater to indicate increased ease and decrease of difficulty with daily activities and tasks.  Comments: 70/80  ONGOING  4 Pt will write a paragraph of 3 or more  sentences with report of decrease in fatigue and soreness. Comments: reports significant fatigue and soreness with handwriting  ONGOING  5 Pt will report pain of 1/10 or less in BUE hands/thumbs with HEP and during functional activities Comments: 2/10 reported pain/soreness  ONGOING    PLAN: OT FREQUENCY: 1x/week  OT DURATION: 4 weeks  PLANNED INTERVENTIONS: self care/ADL training, therapeutic exercise, therapeutic activity, manual therapy, splinting, ultrasound, paraffin, fluidotherapy, traction, moist heat, cryotherapy, patient/family education, psychosocial skills training and DME and/or AE instructions  PLAN FOR NEXT SESSION:  Fluido or Paraffin - not a fan of ultrasound but see if any decrease in pain since using it. Try some [facility] braces possibly?  RECOMMENDED OTHER SERVICES: N/A  CONSULTED AND AGREED WITH PLAN OF CARE: Patient   Managed medicaid CPT codes: 50093- Therapeutic Exercise, 97140 - Manual Therapy, 97530 - Therapeutic Activities, 97535 - Self Care, [phone] - Mechanical traction, 97035 - Ultrasound, [phone] - Orthotic Fit, [id number] - Fluidotherapy and [phone] -  Paraffin      Junious Dresser MOT, OTR/L  10/14/2020, 6:18 PM    [facility] [address] [place], [place], [address] Phone: [phone]   Fax:  [phone]  Patient name: Diane Zuniga MRN: [id number] DOB: [birth year]

## 2020-10-14 NOTE — Patient Instructions (Signed)
Palmar Adduction/Abduction (Active)    Move thumb down, away from palm. Move back to rest along palm. Repeat ____ times. Do ____ sessions per day.  Radial Adduction/Abduction (Active)    Move thumb out to side. Move back alongside index finger. Repeat ____ times. Do ____ sessions per day.  Copyright  VHI. All rights reserved.    Copyright  VHI. All rights reserved.

## 2020-10-21 ENCOUNTER — Encounter: Payer: Medicaid Other | Admitting: Occupational Therapy

## 2020-10-26 DIAGNOSIS — F909 Attention-deficit hyperactivity disorder, unspecified type: Secondary | ICD-10-CM | POA: Diagnosis not present

## 2020-10-26 NOTE — Progress Notes (Signed)
   Subjective:   Patient ID: Diane Zuniga    DOB: 06-Nov-2000, 20 y.o. female   MRN: [id number]  Diane Zuniga is a 20 y.o. female with a history of anemia, anxiety, F/H cardiomyopathy, urinary urgency, weakness of both hands here for follow up  Anemia: History of heavy menstrual cycles. Last seen on 09/29/20 for dizziness and SOB. Hgb at that time was 8.1. She was started on iron supplement as well as given iron infusion. Recommended to follow up prior to menstrual cycle for further evaluation.  Vitamin B12 and electrophoresis was normal. Not currently on birth control, this was discussed at last visit. Endorses compliance with iron supplement at home. She is hesitant to start birth control due to extensive family history of "uterine diseases" including uterine cancer.   Review of Systems:  Per HPI.   Objective:   BP 108/74   Pulse (!) 108   Ht 5\' 10"  (1.778 m)   Wt 177 lb 12.8 oz (80.6 kg)   LMP 09/28/2020 (Exact Date)   SpO2 99%   BMI 25.51 kg/m  Vitals and nursing note reviewed.  General: pleasant young female, sitting comfortably in exam chair, well nourished, well developed, in no acute distress with non-toxic appearance Resp: breathing comfortably on room air, speaking in full sentences MSK: gait normal Neuro: Alert and oriented, speech normal  Assessment & Plan:   Iron deficiency anemia Chronic, improved. POC Hgb 12.2. CBC today to confirm.  Known iron deficiency. Suspect secondary to heavy menstrual periods. S/p IV iron infusion and PO iron. Follow up ferritin improved from 7 > 77.  Encouraged to continue PO iron. Discussed birth control however she is hesitant to start given her family history of uterine cancer. BC should lower risk of uterine cancer however she is very anxious at baseline thus I did not push her to start [facility] today. She did desire referral to genetics which was placed today.  Recommended follow up in 3 months for repeat CBC and Ferritin. Consider  obtaining celiac testing at that time to rule out. Encouraged to follow up sooner if redevelops symptoms. She voiced understanding and agreement with plan.  Family history of uterine cancer Multiple paternal females with history. Desired genetics referral. Referral placed.  Orders Placed This Encounter  Procedures   Ferritin   Reticulocytes   Specimen Status   Ambulatory referral to Genetics    Referral Priority:   Routine    Referral Type:   Consultation    Referral Reason:   Specialty Services Required    Number of Visits Requested:   1   POCT hemoglobin    Associate with Z13.0   No orders of the defined types were placed in this encounter.     [facility] DO PGY-3, [facility] 10/28/2020 8:08 PM

## 2020-10-27 ENCOUNTER — Other Ambulatory Visit: Payer: Self-pay

## 2020-10-27 ENCOUNTER — Ambulatory Visit (INDEPENDENT_AMBULATORY_CARE_PROVIDER_SITE_OTHER): Payer: Medicaid Other | Admitting: Family Medicine

## 2020-10-27 VITALS — BP 108/74 | HR 108 | Ht 70.0 in | Wt 177.8 lb

## 2020-10-27 DIAGNOSIS — Z8049 Family history of malignant neoplasm of other genital organs: Secondary | ICD-10-CM | POA: Diagnosis not present

## 2020-10-27 DIAGNOSIS — D500 Iron deficiency anemia secondary to blood loss (chronic): Secondary | ICD-10-CM

## 2020-10-27 LAB — POCT HEMOGLOBIN: Hemoglobin: 12.2 g/dL (ref 11–14.6)

## 2020-10-27 NOTE — Patient Instructions (Signed)
Your Hemoglobin is much improved to 12.2 (up from 8.1). Please continue your iron supplement every day. Follow up in 3 months to continue to monitor and make sure your iron level is improving appropriately. Follow up sooner if you start to develop symptoms like dizziness, lightheadedness, shortness of breath like before.   I have placed a referral to genetics given your family history.

## 2020-10-28 ENCOUNTER — Ambulatory Visit: Payer: Medicaid Other | Admitting: Occupational Therapy

## 2020-10-28 DIAGNOSIS — Z8049 Family history of malignant neoplasm of other genital organs: Secondary | ICD-10-CM | POA: Insufficient documentation

## 2020-10-28 LAB — SPECIMEN STATUS

## 2020-10-28 NOTE — Assessment & Plan Note (Signed)
Multiple paternal females with history. Desired genetics referral. Referral placed.

## 2020-10-28 NOTE — Assessment & Plan Note (Signed)
Chronic, improved. POC Hgb 12.2. CBC today to confirm.  Known iron deficiency. Suspect secondary to heavy menstrual periods. S/p IV iron infusion and PO iron. Follow up ferritin improved from 7 > 77.  Encouraged to continue PO iron. Discussed birth control however she is hesitant to start given her family history of uterine cancer. BC should lower risk of uterine cancer however she is very anxious at baseline thus I did not push her to start [facility] today. She did desire referral to genetics which was placed today.  Recommended follow up in 3 months for repeat CBC and Ferritin. Consider obtaining celiac testing at that time to rule out. Encouraged to follow up sooner if redevelops symptoms. She voiced understanding and agreement with plan.

## 2020-10-29 ENCOUNTER — Telehealth: Payer: Self-pay | Admitting: Genetic Counselor

## 2020-10-29 LAB — FERRITIN: Ferritin: 77 ng/mL (ref 15–150)

## 2020-10-29 NOTE — Telephone Encounter (Signed)
Scheduled app per 6/10 inbasket msg. Pt aware.

## 2020-11-03 ENCOUNTER — Ambulatory Visit: Payer: Medicaid Other | Admitting: Family Medicine

## 2020-11-04 LAB — RETICULOCYTES: Retic Ct Pct: 1 % (ref 0.6–2.6)

## 2020-11-04 LAB — SPECIMEN STATUS REPORT

## 2020-11-08 ENCOUNTER — Encounter: Payer: Self-pay | Admitting: Occupational Therapy

## 2020-11-08 NOTE — Therapy (Signed)
   OCCUPATIONAL THERAPY DISCHARGE SUMMARY   Visits from Start of Care: 3  Current functional level related to goals / functional outcomes: Unable to check goals d/t not returning for discharge visit. Pt reported doing well and not needing to come back so a non visit discharge is completed.   Remaining deficits: See last note.   Education / Equipment: HEPs for exercises, theraputty, etc.  Plan: Patient agrees to discharge.  Patient goals were not met. Patient is being discharged due to not returning since the last visit.

## 2020-11-11 DIAGNOSIS — F909 Attention-deficit hyperactivity disorder, unspecified type: Secondary | ICD-10-CM | POA: Diagnosis not present

## 2020-11-16 ENCOUNTER — Other Ambulatory Visit: Payer: Self-pay

## 2020-11-16 ENCOUNTER — Inpatient Hospital Stay: Payer: Medicaid Other

## 2020-11-16 ENCOUNTER — Inpatient Hospital Stay: Payer: Medicaid Other | Attending: Genetic Counselor | Admitting: Genetic Counselor

## 2020-11-16 DIAGNOSIS — Z8049 Family history of malignant neoplasm of other genital organs: Secondary | ICD-10-CM

## 2020-11-16 DIAGNOSIS — Z8042 Family history of malignant neoplasm of prostate: Secondary | ICD-10-CM

## 2020-11-19 ENCOUNTER — Encounter: Payer: Self-pay | Admitting: Genetic Counselor

## 2020-11-19 DIAGNOSIS — Z8042 Family history of malignant neoplasm of prostate: Secondary | ICD-10-CM | POA: Insufficient documentation

## 2020-11-19 DIAGNOSIS — F909 Attention-deficit hyperactivity disorder, unspecified type: Secondary | ICD-10-CM | POA: Diagnosis not present

## 2020-11-19 NOTE — Progress Notes (Signed)
REFERRING PROVIDER: Danna Hefty, DO [address],  [place] [address]  PRIMARY PROVIDER:  Sharion Settler, DO  PRIMARY REASON FOR VISIT:  1. Family history of uterine cancer   2. Family history of prostate cancer      HISTORY OF PRESENT ILLNESS:   Diane Zuniga, a 20 y.o. female, was seen for a [facility] cancer genetics consultation at the request of Dr. Tarry Kos due to a family history of cancer.  Diane Zuniga presents to clinic today to discuss the possibility of a hereditary predisposition to cancer, genetic testing, and to further clarify her future cancer risks, as well as potential cancer risks for family members.   Diane Zuniga does not have a personal history of cancer.    RISK FACTORS:  Menarche was at age 40.  No live births.  OCP use: 0 years.  Ovaries intact: yes.  Hysterectomy: no.  Menopausal status: premenopausal.  HRT use: 0 years. Colonoscopy: no; not examined. Mammogram within the last year: n/a. Number of breast biopsies: 0. Any excessive radiation exposure in the past: no.   Past Medical History:  Diagnosis Date   Anxiety    Back pain 03/31/2020   COVID-19 vaccine administered 05/26/2020   Family history of cardiomyopathy    Family history of prostate cancer    Family history of uterine cancer    Left knee pain 03/31/2020   Palpitations     No past surgical history on file.  Social History   Socioeconomic History   Marital status: Single    Spouse name: Not on file   Number of children: Not on file   Years of education: Not on file   Highest education level: Not on file  Occupational History   Not on file  Tobacco Use   Smoking status: Never   Smokeless tobacco: Never  Substance and Sexual Activity   Alcohol use: Not on file   Drug use: Not on file   Sexual activity: Not on file  Other Topics Concern   Not on file  Social History Narrative   Not on file   Social Determinants of Health   Financial  Resource Strain: Not on file  Food Insecurity: Not on file  Transportation Needs: Not on file  Physical Activity: Not on file  Stress: Not on file  Social Connections: Not on file     FAMILY HISTORY:  We obtained a detailed, 4-generation family history.  Significant diagnoses are listed below: Family History  Problem Relation Age of Onset   Cardiomyopathy Mother 72   Cardiomyopathy Brother 47   Autism Brother    Fibroids Paternal Aunt    Uterine cancer Paternal Aunt        dx late 42s   Fibroids Paternal Aunt    Fibroids Paternal Aunt    Fibroids Paternal Aunt    Uterine cancer Paternal Grandmother        dx 57s, hysterectomy   Prostate cancer Paternal Grandfather 59       metastatic   Cancer Other        hysterectomy due to cancer (dx 10s) in 3 paternal great-aunts   Diabetes Half-Brother    Uterine cancer Paternal Great-grandmother        hysterectomy   Diane Zuniga does not have children. She has three full-brothers (ages 75-32) and she had one maternal half-brother who died at age 70. None of these siblings have had cancer.  Diane Zuniga mother died at age 22 without cancer.  Patient's mother was adopted; therefore, there is no information known about maternal relatives.  Diane Zuniga father is alive at age 36 without cancer. There are four paternal aunts. One aunt was diagnosed with uterine cancer in her late 22s. There is no known cancer among paternal cousins. Diane Zuniga paternal grandmother died at age 1 and had a history of uterine cancer treated with hysterectomy in her 30s. Her paternal grandfather died at age 72 with metastatic prostate cancer. A paternal great-grandmother (PGM's mother) had a hysterectomy due to uterine cancer. Additionally, three paternal great-aunts (PGM's sisters) had hysterectomies in their 49s due to cancer.  Diane Zuniga is unaware of previous family history of genetic testing for hereditary cancer risks. Patient's  maternal ancestors are of Black/African American, White/Caucasian, and otherwise unknown descent, and paternal ancestors are of Black/African American and otherwise unknown descent. There is no reported Ashkenazi Jewish ancestry. There is no known consanguinity.  GENETIC COUNSELING ASSESSMENT: Diane Zuniga is a 20 y.o. female with a family history of young-onset uterine cancer and metastatic prostate cancer, which is somewhat suggestive of a hereditary cancer syndrome and predisposition to cancer. We, therefore, discussed and recommended the following at today's visit.   DISCUSSION: We discussed that approximately 5-10% of cancer is hereditary, with most cases of hereditary uterine cancer associated with Lynch syndrome. There are other genes that can be associated with hereditary uterine cancer, such as PTEN, STK11, and TP53. We discussed that testing is beneficial for several reasons, including knowing about other cancer risks, identifying potential screening and risk-reduction options that may be appropriate, and to understand if other family members could be at risk for cancer and allow them to undergo genetic testing.   We reviewed the characteristics, features and inheritance patterns of hereditary cancer syndromes. We also discussed genetic testing, including the appropriate family members to test, the process of testing, insurance coverage and turn-around-time for results. We discussed the implications of a negative, positive and/or variant of uncertain significant result. We recommended Diane Zuniga pursue genetic testing for the [employer] + RNA gene panel.   The Multi-Cancer + RNA Panel offered by [facility] includes sequencing and/or deletion/duplication analysis of the following 84 genes:  AIP*, ALK, APC*, ATM*, AXIN2*, BAP1*, BARD1*, BLM*, BMPR1A*, BRCA1*, BRCA2*, BRIP1*, CASR, CDC73*, CDH1*, CDK4, CDKN1B*, CDKN1C*, CDKN2A, CEBPA, CHEK2*, CTNNA1*, DICER1*, DIS3L2*, EGFR, EPCAM, FH*,  FLCN*, GATA2*, GPC3, GREM1, HOXB13, HRAS, KIT, MAX*, MEN1*, MET, MITF, MLH1*, MSH2*, MSH3*, MSH6*, MUTYH*, NBN*, NF1*, NF2*, NTHL1*, PALB2*, PDGFRA, PHOX2B, PMS2*, POLD1*, POLE*, POT1*, PRKAR1A*, PTCH1*, PTEN*, RAD50*, RAD51C*, RAD51D*, RB1*, RECQL4, RET, RUNX1*, SDHA*, SDHAF2*, SDHB*, SDHC*, SDHD*, SMAD4*, SMARCA4*, SMARCB1*, SMARCE1*, STK11*, SUFU*, TERC, TERT, TMEM127*, Tp53*, TSC1*, TSC2*, VHL*, WRN*, and WT1.  RNA analysis is performed for * genes.   Based on Diane Zuniga's family history of cancer, she meets medical criteria for genetic testing. Despite that she meets criteria, there may still be an out of pocket cost.   We discussed that some people do not want to undergo genetic testing due to fear of genetic discrimination. A federal law called the Genetic Information Non-Discrimination Act (GINA) of 2008 helps protect individuals against genetic discrimination based on their genetic test results. It impacts both health insurance and employment. With health insurance, it protects against increased premiums, being kicked off insurance or being forced to take a test in order to be insured. For employment it protects against hiring, firing and promoting decisions based on genetic test results.  Health status due to a cancer diagnosis is  not protected under GINA. Additionally, life, disability, and long-term care insurance is not protected under GINA.   PLAN: After considering the risks, benefits, and limitations, Diane Zuniga provided informed consent to pursue genetic testing and the blood sample was sent to [facility] for analysis of the Multi-Cancer + RNA panel. Results should be available within approximately two-three weeks' time, at which point they will be disclosed by telephone to Diane Zuniga, as will any additional recommendations warranted by these results. Diane Zuniga will receive a summary of her genetic counseling visit and a copy of her results once available. This  information will also be available in Epic.   Diane Zuniga questions were answered to her satisfaction today. Our contact information was provided should additional questions or concerns arise. Thank you for the referral and allowing [place] to share in the care of your patient.   Clint Guy, [place], [facility] Licensed, Certified Dispensing [email or web address] Phone: [phone]  The patient was seen for a total of 35 minutes in face-to-face genetic counseling. Patient was seen alone. This patient was discussed with Drs. Magrinat, Lindi Adie and/or Burr Medico who agrees with the above.    _______________________________________________________________________ For Office Staff:  Number of people involved in session: 1 Was an Intern/ student involved with case: no

## 2020-11-26 DIAGNOSIS — F909 Attention-deficit hyperactivity disorder, unspecified type: Secondary | ICD-10-CM | POA: Diagnosis not present

## 2020-11-30 ENCOUNTER — Other Ambulatory Visit: Payer: Self-pay

## 2020-11-30 ENCOUNTER — Ambulatory Visit: Payer: Medicaid Other | Admitting: Family Medicine

## 2020-11-30 VITALS — BP 100/74 | HR 103 | Wt 183.0 lb

## 2020-11-30 DIAGNOSIS — N939 Abnormal uterine and vaginal bleeding, unspecified: Secondary | ICD-10-CM

## 2020-11-30 DIAGNOSIS — M25561 Pain in right knee: Secondary | ICD-10-CM | POA: Diagnosis not present

## 2020-11-30 DIAGNOSIS — D649 Anemia, unspecified: Secondary | ICD-10-CM | POA: Diagnosis not present

## 2020-11-30 DIAGNOSIS — R5383 Other fatigue: Secondary | ICD-10-CM

## 2020-11-30 DIAGNOSIS — G8929 Other chronic pain: Secondary | ICD-10-CM | POA: Diagnosis not present

## 2020-11-30 DIAGNOSIS — M25562 Pain in left knee: Secondary | ICD-10-CM

## 2020-11-30 LAB — POCT HEMOGLOBIN: Hemoglobin: 12.2 g/dL (ref 11–14.6)

## 2020-11-30 NOTE — Patient Instructions (Addendum)
It was great to see you today!  Here's what we talked about:  You have been having pain in your knees for a while now that has not resolved since your last visit. We believe this could be due to a condition called Patellofemoral Pain Syndrome, a condition  We will refer you to physical therapy to help strengthen your knees. You can also take the recommended dose of ibuprofen and tylenol together for 2 weeks. We will see you in 2 months after physical therapy.  Take care and seek immediate care sooner if you develop any concerns.  Diane Zuniga "Diane Zuniga" Diane Zuniga, MS3 [facility]

## 2020-11-30 NOTE — Progress Notes (Addendum)
SUBJECTIVE:   CHIEF COMPLAINT / HPI:   Diane Zuniga (MRN: [id number]) is a 20 y.o. female with a history of anxiety, back pain, and left knee pain who is being evaluated for bilateral knee pain and weakness.  Bilateral knee pain and weakness Patient states she has been having bilateral knee pain for about a year. Over the last month, her pain has gotten worse. She also states her knees feel weak after standing for short periods of time. She states her knees hurt the worst when she is standing or sitting for long periods of time. 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 She has tried tylenol, Voltaren gel, and diclofenac gel with minimal relief. She has also tried occupational therapy and has been to sports medicine where she was given exercises that have also helped minimally. Sometimes, she also has tingling of her legs from her mid-thighs to her toes bilaterally. She states she has a history of anemia and that her B12 levels have been normal in the past. She also endorses a history of painful joints in the hands. She believes she may have Ehler-Danlos syndrome, and she states she has undiagnosed autism and ADHD. She denies any trauma, fever, chills, nausea, vomiting, bowel changes, or urinary changes.  Referral to OB Patient states she was supposed to have an OB referral at her last visit, but they never called. She is interested in seeing them due to her heavy menstrual bleeding, and she believes she may have fibroids. She takes an iron supplement daily due to a history of IDA. She is not interested in contraception at this time due to her not being sexually active and her wanting to see the OB first to rule out other causes of her heavy menstrual bleeding.  PERTINENT  PMH / PSH: Anxiety, back pain, left knee pain  OBJECTIVE:   BP 100/74   Pulse (!) 103   Wt 183 lb (83 kg)   SpO2 97%   BMI 26.26 kg/m    PHYSICAL EXAM  GEN: Well developed, in  NAD, conversant HEAD: NCAT, neck supple  MSK: Tenderness to palpation of the medial and lateral joint spaces of the knees bilaterally. Crepitus present in R knee on passive extension. No pain with Thessaly test. Weight bearing and gait normal. Normal ROM.  ASSESSMENT/PLAN:   No problem-specific Assessment & Plan notes found for this encounter.   Patellofemoral pain syndrome Patient has experienced knee pain for a year with symptoms worsening in the last month. Sports medicine exercises, OT, and conservative management were minimally helpful. Given patient's persistent pain with extensive ambulation and sitting, in conjunction with her sex and age, patellofemoral pain syndrome is most likely. Will recommend alternating tylenol and ibuprofen every 4 hours for 2 weeks in conjunction with physical therapy. Will assess improvement in 2 months or sooner if further concerns arise.  Referral to [facility] Patient requested another referral to [facility] for heavy menstrual bleeding. Patient endorses taking iron supplement daily for history of IDA and heavy periods. She is currently on her period now and endorses heavy bleeding. Due to her current period as well as her fatigue and weakness, POCT Hgb ordered which was 12.2 and stable from a month ago. Referral for OB placed for further evaluation. Patient is amenable to discussing contraception at 2 month visit after she sees OB.  Samantha Crimes, Medical Student [facility]   Resident Attestation  I  saw and evaluated the patient, performing the key elements of the service.I  personally performed or re-performed the history, physical exam, and medical decision making activities of this service and have verified that the service and findings are accurately documented in the student's note. I developed the management plan that is described in the medical student's note, and I agree with the content, with my edits above.    Derrel Nip, PGY3

## 2020-12-05 DIAGNOSIS — Z1379 Encounter for other screening for genetic and chromosomal anomalies: Secondary | ICD-10-CM | POA: Insufficient documentation

## 2020-12-07 ENCOUNTER — Ambulatory Visit: Payer: Self-pay | Admitting: Genetic Counselor

## 2020-12-07 ENCOUNTER — Telehealth: Payer: Self-pay | Admitting: Genetic Counselor

## 2020-12-07 ENCOUNTER — Encounter: Payer: Self-pay | Admitting: Genetic Counselor

## 2020-12-07 DIAGNOSIS — Z1379 Encounter for other screening for genetic and chromosomal anomalies: Secondary | ICD-10-CM

## 2020-12-07 NOTE — Telephone Encounter (Signed)
Revealed negative genetic testing. Discussed that we do not know why there is cancer in the family. There could be a genetic mutation in the family that Diane Zuniga did not inherit. There could also be a mutation in a different gene that we are not testing, or our current technology may not be able to detect certain mutations. It will therefore be important for her to stay in contact with genetics to keep up with whether additional testing may be appropriate in the future.   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 Her result is still considered normal at this time and should not impact her medical management.

## 2020-12-07 NOTE — Progress Notes (Signed)
HPI:  [name] was previously seen in the [facility] clinic due to a family history of cancer and concerns regarding a hereditary predisposition to cancer. Please refer to our prior cancer genetics clinic note for more information regarding our discussion, assessment and recommendations, at the time. [name] recent genetic test results were disclosed to her, as were recommendations warranted by these results. These results and recommendations are discussed in more detail below.  CANCER HISTORY:  Oncology History   No history exists.    FAMILY HISTORY:  We obtained a detailed, 4-generation family history.  Significant diagnoses are listed below: Family History  Problem Relation Age of Onset   Cardiomyopathy Mother 61   Cardiomyopathy Brother 86   Autism Brother    Fibroids Paternal Aunt    Uterine cancer Paternal Aunt        dx late 13s   Fibroids Paternal Aunt    Fibroids Paternal Aunt    Fibroids Paternal Aunt    Uterine cancer Paternal Grandmother        dx 69s, hysterectomy   Prostate cancer Paternal Grandfather 2       metastatic   Cancer Other        hysterectomy due to cancer (dx 34s) in 3 paternal great-aunts   Diabetes Half-Brother    Uterine cancer Paternal Great-grandmother        hysterectomy   Ms. Pelzer does not have children. She has three full-brothers (ages 27-32) and she had one maternal half-brother who died at age 74. None of these siblings have had cancer.   Ms. Greenley mother died at age 56 without cancer. Patient's mother was adopted; therefore, there is no information known about maternal relatives.   Ms. Giovannetti father is alive at age 45 without cancer. There are four paternal aunts. One aunt was diagnosed with uterine cancer in her late 60s. There is no known cancer among paternal cousins. Ms. Mceachron paternal grandmother died at age 9 and had a history of uterine cancer treated with hysterectomy in  her 42s. Her paternal grandfather died at age 7 with metastatic prostate cancer. A paternal great-grandmother (PGM's mother) had a hysterectomy due to uterine cancer. Additionally, three paternal great-aunts (PGM's sisters) had hysterectomies in their 19s due to cancer.   Ms. Popowski is unaware of previous family history of genetic testing for hereditary cancer risks. Patient's maternal ancestors are of Black/African American, White/Caucasian, and otherwise unknown descent, and paternal ancestors are of Black/African American and otherwise unknown descent. There is no reported Ashkenazi Jewish ancestry. There is no known consanguinity.  GENETIC TEST RESULTS: Genetic testing reported out on 12/05/2020 through the [facility] Multi-Cancer + RNA panel. No pathogenic variants were detected.   The Multi-Cancer + RNA Panel offered by [facility] includes sequencing and/or deletion/duplication analysis of the following 84 genes:  AIP*, ALK, APC*, ATM*, AXIN2*, BAP1*, BARD1*, BLM*, BMPR1A*, BRCA1*, BRCA2*, BRIP1*, CASR, CDC73*, CDH1*, CDK4, CDKN1B*, CDKN1C*, CDKN2A, CEBPA, CHEK2*, CTNNA1*, DICER1*, DIS3L2*, EGFR, EPCAM, FH*, FLCN*, GATA2*, GPC3, GREM1, HOXB13, HRAS, KIT, MAX*, MEN1*, MET, MITF, MLH1*, MSH2*, MSH3*, MSH6*, MUTYH*, NBN*, NF1*, NF2*, NTHL1*, PALB2*, PDGFRA, PHOX2B, PMS2*, POLD1*, POLE*, POT1*, PRKAR1A*, PTCH1*, PTEN*, RAD50*, RAD51C*, RAD51D*, RB1*, RECQL4, RET, RUNX1*, SDHA*, SDHAF2*, SDHB*, SDHC*, SDHD*, SMAD4*, SMARCA4*, SMARCB1*, SMARCE1*, STK11*, SUFU*, TERC, TERT, TMEM127*, Tp53*, TSC1*, TSC2*, VHL*, WRN*, and WT1.  RNA analysis is performed for * genes. The test report will be scanned into EPIC and located under the Molecular Pathology section of the Results Review tab.  A portion of the result report is included below for reference.     We discussed with [name] that because current genetic testing is not perfect, it is possible there may be a gene mutation in one of these genes that  current testing cannot detect, but that chance is small.  We also discussed that there could be another gene that has not yet been discovered, or that we have not yet tested, that is responsible for the cancer diagnoses in the family. It is also possible there is a hereditary cause for the cancer in the family that [name] did not inherit and therefore was not identified in her testing.  Therefore, it is important to remain in touch with cancer genetics in the future so that we can continue to offer [name] the most up to date genetic testing.   Genetic testing did identify a variant of uncertain significance (VUS) in the CDKN2A gene called c.400G>C (p.Ala134Pro).  At this time, it is unknown if this variant is associated with increased cancer risk or if this is a normal finding, but most variants such as this get reclassified to being inconsequential. It should not be used to make medical management decisions. With time, we suspect the lab will determine the significance of this variant, if any. If we do learn more about it, we will try to contact [name] to discuss it further. However, it is important to stay in touch with [place] periodically and keep the address and phone number up to date.  CANCER SCREENING RECOMMENDATIONS: [name] test result is considered negative (normal).  This means that we have not identified a hereditary cause for her family history of cancer at this time. While reassuring, this does not definitively rule out a hereditary predisposition to cancer. It is still possible that there could be genetic mutations that are undetectable by current technology. There could be genetic mutations in genes that have not been tested or identified to increase cancer risk.  Therefore, it is recommended she continue to follow the cancer management and screening guidelines provided by her primary healthcare provider.   An individual's cancer risk and medical management are  not determined by genetic test results alone. Overall cancer risk assessment incorporates additional factors, including personal medical history, family history, and any available genetic information that may result in a personalized plan for cancer prevention and surveillance.  Breast Cancer Risk: Based on [name]'s personal and family history, as well as her genetic test results, the [place] was used to estimate her risk of developing breast cancer. Tyrer-Cuzick estimates her lifetime risk of developing breast cancer to be approximately 13.3%. This lifetime breast cancer risk is a preliminary estimate based on available information using one of several models endorsed by the [place] (ACS). The ACS recommends consideration of breast MRI screening as an adjunct to mammography for patients at high risk (defined as 20% or greater lifetime risk). A more detailed breast cancer risk assessment can be considered, if clinically indicated. This risk estimate can change over time and that this calculation may be repeated to reflect new information in her personal or family history in the future.     RECOMMENDATIONS FOR FAMILY MEMBERS:  Individuals in this family might be at some increased risk of developing cancer, over the general population risk, simply due to the family history of cancer.  We recommended women in this family have a yearly mammogram beginning at age 78, or 9 years  younger than the earliest onset of cancer, an annual clinical breast exam, and perform monthly breast self-exams. Women in this family should also have a gynecological exam as recommended by their primary provider. All family members should be referred for colonoscopy starting at age 79.  It is also possible there is a hereditary cause for the cancer in [name]'s family that she did not inherit and therefore was not identified in her.  Based on [name]'s family history, we  recommended her paternal aunt who has had uterine cancer have genetic counseling and testing. Ms. Cashaw will let us know if we can be of any assistance in coordinating genetic counseling and/or testing for this family member.   FOLLOW-UP: Lastly, we discussed with [name] that cancer genetics is a rapidly advancing field and it is possible that new genetic tests will be appropriate for her and/or her family members in the future. We encouraged her to remain in contact with cancer genetics on an annual basis so we can update her personal and family histories and let her know of advances in cancer genetics that may benefit this family.   Our contact number was provided. [name] questions were answered to her satisfaction, and she knows she is welcome to call us at anytime with additional questions or concerns.   Clint Guy, MS, [facility] Genetic Counselor [email or web address] Phone: [phone]

## 2020-12-14 DIAGNOSIS — F339 Major depressive disorder, recurrent, unspecified: Secondary | ICD-10-CM | POA: Diagnosis not present

## 2020-12-14 DIAGNOSIS — F909 Attention-deficit hyperactivity disorder, unspecified type: Secondary | ICD-10-CM | POA: Diagnosis not present

## 2020-12-14 DIAGNOSIS — F411 Generalized anxiety disorder: Secondary | ICD-10-CM | POA: Diagnosis not present

## 2020-12-16 DIAGNOSIS — F909 Attention-deficit hyperactivity disorder, unspecified type: Secondary | ICD-10-CM | POA: Diagnosis not present

## 2020-12-24 DIAGNOSIS — F909 Attention-deficit hyperactivity disorder, unspecified type: Secondary | ICD-10-CM | POA: Diagnosis not present

## 2020-12-27 ENCOUNTER — Ambulatory Visit: Payer: Medicaid Other | Admitting: Obstetrics & Gynecology

## 2021-01-06 DIAGNOSIS — F909 Attention-deficit hyperactivity disorder, unspecified type: Secondary | ICD-10-CM | POA: Diagnosis not present

## 2021-01-17 DIAGNOSIS — F909 Attention-deficit hyperactivity disorder, unspecified type: Secondary | ICD-10-CM | POA: Diagnosis not present

## 2021-01-17 DIAGNOSIS — F411 Generalized anxiety disorder: Secondary | ICD-10-CM | POA: Diagnosis not present

## 2021-01-17 DIAGNOSIS — F339 Major depressive disorder, recurrent, unspecified: Secondary | ICD-10-CM | POA: Diagnosis not present

## 2021-01-21 DIAGNOSIS — F909 Attention-deficit hyperactivity disorder, unspecified type: Secondary | ICD-10-CM | POA: Diagnosis not present

## 2021-01-31 DIAGNOSIS — F909 Attention-deficit hyperactivity disorder, unspecified type: Secondary | ICD-10-CM | POA: Diagnosis not present

## 2021-02-01 ENCOUNTER — Ambulatory Visit: Payer: Medicaid Other | Admitting: Obstetrics & Gynecology

## 2021-02-01 ENCOUNTER — Ambulatory Visit: Payer: Medicaid Other

## 2021-02-03 ENCOUNTER — Other Ambulatory Visit: Payer: Self-pay

## 2021-02-03 ENCOUNTER — Ambulatory Visit (INDEPENDENT_AMBULATORY_CARE_PROVIDER_SITE_OTHER): Payer: Medicaid Other | Admitting: Sports Medicine

## 2021-02-03 VITALS — Ht 70.0 in | Wt 190.0 lb

## 2021-02-03 DIAGNOSIS — R269 Unspecified abnormalities of gait and mobility: Secondary | ICD-10-CM

## 2021-02-03 NOTE — Progress Notes (Signed)
   Diane Zuniga is a 20 y.o. female who presents to [facility] today for the following:  Gait Abnormality  Prior custom orthotics made 05/25/20 Has heel lift on left for leg length discrepancy  Has had b/l medium scaphoid pads Feels like over the last few months that her left food is supinating more while she is walking on the grass on campus She also feels like her insoles are not soft enough and wonders if there is something that can be done to make them softer.  PMH reviewed.  ROS as above. Medications reviewed.  Exam:  Ht 5\' 10"  (1.778 m)   Wt 190 lb (86.2 kg)   BMI 27.26 kg/m  Gen: Well NAD MSK:  Bilateral Feet: Inspection:  Bilateral Pes Planus Palpation: No tenderness to palpation b/l ROM: Full  ROM of the ankle b/l Neurovascular: N/V intact distally in the lower extremity b/l  Gait: excessive pronation and supination b/l throughout gait cycle with very loose fitting shoes b/l   No results found.   Assessment and Plan: 1) Abnormality of gait Removed scaphoid pads on orthotics as they were worn.  Replaced with small scaphoid pads b/l.  Also tightened her laces and added a heel lock b/l.  Gait afterwards was neutral.  Given she does not have specific pain from orthotics, advised getting a shoe with more cushioning.  She was happy with the changes.  Can f/u as needed.    , D.O.  PGY-4 [facility]  02/03/2021 2:08 PM

## 2021-02-03 NOTE — Assessment & Plan Note (Signed)
Removed scaphoid pads on orthotics as they were worn.  Replaced with small scaphoid pads b/l.  Also tightened her laces and added a heel lock b/l.  Gait afterwards was neutral.  Given she does not have specific pain from orthotics, advised getting a shoe with more cushioning.  She was happy with the changes.  Can f/u as needed.

## 2021-02-28 DIAGNOSIS — F909 Attention-deficit hyperactivity disorder, unspecified type: Secondary | ICD-10-CM | POA: Diagnosis not present

## 2021-03-02 DIAGNOSIS — F89 Unspecified disorder of psychological development: Secondary | ICD-10-CM | POA: Diagnosis not present

## 2021-03-08 DIAGNOSIS — F909 Attention-deficit hyperactivity disorder, unspecified type: Secondary | ICD-10-CM | POA: Diagnosis not present

## 2021-03-11 DIAGNOSIS — F329 Major depressive disorder, single episode, unspecified: Secondary | ICD-10-CM | POA: Diagnosis not present

## 2021-04-05 DIAGNOSIS — F909 Attention-deficit hyperactivity disorder, unspecified type: Secondary | ICD-10-CM | POA: Diagnosis not present

## 2021-04-08 DIAGNOSIS — F909 Attention-deficit hyperactivity disorder, unspecified type: Secondary | ICD-10-CM | POA: Diagnosis not present

## 2021-04-11 ENCOUNTER — Ambulatory Visit (INDEPENDENT_AMBULATORY_CARE_PROVIDER_SITE_OTHER): Payer: Medicaid Other | Admitting: Family Medicine

## 2021-04-11 ENCOUNTER — Encounter: Payer: Self-pay | Admitting: Family Medicine

## 2021-04-11 ENCOUNTER — Other Ambulatory Visit: Payer: Self-pay

## 2021-04-11 VITALS — BP 132/78 | HR 91 | Wt 210.6 lb

## 2021-04-11 DIAGNOSIS — F649 Gender identity disorder, unspecified: Secondary | ICD-10-CM | POA: Diagnosis not present

## 2021-04-11 NOTE — Patient Instructions (Addendum)
It was wonderful to see you today.  Please bring ALL of your medications with you to every visit.   Today we talked about:  --I sent a referral to [facility]  -- Call Dr. Debroah Loop (ob-gyn) [phone]  -- [facility]--discuss egg preservation  [phone]   Thank you for choosing [facility].   Please call [phone] with any questions about today's appointment.  Please be sure to schedule follow up at the front  desk before you leave today.   Terisa Starr, MD  Family Medicine

## 2021-04-11 NOTE — Progress Notes (Signed)
    SUBJECTIVE:   CHIEF COMPLAINT: Top surgery  HPI:   Diane Zuniga is a pleasant 20 year old person who presents today to discuss potential double mastectomy.  He reports over the last 3 years he has started to identify more more as female.  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  He is interested in potentially starting testosterone therapy in the future and having underwent other gender affirming surgeries.  He has a large network of LGBTQ friends and transgender friends.  His parents and family are entirely unaware of his preference and gender identity.  He reports he began to feel more like a man early in his life particularly in his teenage years.  He feels some anxiety about telling this to healthcare providers and others and is very anxious today.  PERTINENT  PMH / PSH/Family/Social History : Updated and reviewed as appropriate  OBJECTIVE:   BP 132/78   Pulse 91   Wt 210 lb 9.6 oz (95.5 kg)   SpO2 98%   BMI 30.22 kg/m   Today's weight:  Last Weight  Most recent update: 04/11/2021 11:19 AM    Weight  95.5 kg (210 lb 9.6 oz)            Review of prior weights: Filed Weights   04/11/21 1118  Weight: 210 lb 9.6 oz (95.5 kg)   cardiac exam is regular rate and rhythm no murmurs rubs or gallops.  Lungs clear bilaterally to auscultation.  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  He is identified as Psychiatric nurse for several years.  Will refer to plastic surgery to discuss procedure given his ongoing back pain from his macromastia.  We did asked the general benefits, side effects and adverse events associated with testosterone therapy.  We also discussed additional resources such as [facility].  Vascular schedule follow-up with his primary care physician Dr. Melba Coon.  He may be interested in starting testosterone therapy in the future.  Healthcare maintenance,  declined Pap smear today.  He is due for this he has anxiety around pelvic exams.  May need a small dose of benzodiazepine prior to this procedure.   Terisa Starr, MD  Family Medicine Teaching Service  [facility]

## 2021-04-26 DIAGNOSIS — F909 Attention-deficit hyperactivity disorder, unspecified type: Secondary | ICD-10-CM | POA: Diagnosis not present

## 2021-05-06 DIAGNOSIS — F909 Attention-deficit hyperactivity disorder, unspecified type: Secondary | ICD-10-CM | POA: Diagnosis not present

## 2021-05-11 DIAGNOSIS — F909 Attention-deficit hyperactivity disorder, unspecified type: Secondary | ICD-10-CM | POA: Diagnosis not present

## 2021-05-27 ENCOUNTER — Other Ambulatory Visit: Payer: Self-pay

## 2021-05-27 ENCOUNTER — Encounter: Payer: Self-pay | Admitting: Family Medicine

## 2021-05-27 ENCOUNTER — Ambulatory Visit (INDEPENDENT_AMBULATORY_CARE_PROVIDER_SITE_OTHER): Payer: Medicaid Other | Admitting: Family Medicine

## 2021-05-27 VITALS — BP 110/60 | HR 93 | Ht 70.0 in | Wt 214.4 lb

## 2021-05-27 DIAGNOSIS — R519 Headache, unspecified: Secondary | ICD-10-CM | POA: Diagnosis not present

## 2021-05-27 DIAGNOSIS — R42 Dizziness and giddiness: Secondary | ICD-10-CM

## 2021-05-27 LAB — POCT HEMOGLOBIN: Hemoglobin: 11.2 g/dL (ref 11–14.6)

## 2021-05-27 MED ORDER — IBUPROFEN 400 MG PO TABS: 400.0000 mg | ORAL_TABLET | Freq: Three times a day (TID) | ORAL | 0 refills | Status: AC | PRN

## 2021-05-27 MED ORDER — MAGNESIUM OXIDE -MG SUPPLEMENT 400 (240 MG) MG PO TABS: 400.0000 mg | ORAL_TABLET | Freq: Every day | ORAL | 0 refills | Status: AC | PRN

## 2021-05-27 NOTE — Progress Notes (Signed)
° °  SUBJECTIVE:   CHIEF COMPLAINT / HPI:   Headache  This is a new problem. Episode onset: Started about 2 weeks ago. The problem occurs intermittently. The problem has been gradually worsening. The pain is located in the Bilateral, temporal and frontal region. Radiates to: Sometimes radiates to her jaw when really bad. Similar to prior headaches: Patient does not usually get a headache. Quality: Dull quality and sometimes sharp. The pain is at a severity of 5/10. Pertinent negatives include no blurred vision or coughing. Associated symptoms comments: Started having nausea yesterday, no vomiting, no fever,no vision change, no weakness. Photophobia at baseline which is unchanged. She felt dizzy last week but not now. She will like to check her hemoglobin level.. Exacerbated by: Headache worsen when she goes to work. 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 No caffeine intake. She is not sexually active and hence not on OCPs. She denies dizziness now. He has tried acetaminophen for the symptoms. The treatment provided mild relief.    PERTINENT  PMH / PSH: PMHx reviewed  OBJECTIVE:   BP 110/60    Pulse 93    Ht 5\' 10"  (1.778 m)    Wt 214 lb 6 oz (97.2 kg)    LMP 05/22/2021    SpO2 98%    BMI 30.76 kg/m   Physical Exam Vitals and nursing note reviewed.  Constitutional:      Appearance: He is not ill-appearing.  Cardiovascular:     Rate and Rhythm: Normal rate and regular rhythm.     Heart sounds: Normal heart sounds. No murmur heard. Pulmonary:     Effort: Pulmonary effort is normal. No respiratory distress.     Breath sounds: Normal breath sounds. No wheezing.  Abdominal:     General: Abdomen is flat. Bowel sounds are normal. There is no distension.     Palpations: Abdomen is soft.     Tenderness: There is no abdominal tenderness.  Musculoskeletal:     Right lower leg: No edema.     Left lower leg: No edema.  Neurological:     Mental Status: He is alert.     Cranial Nerves: Cranial  nerves 2-12 are intact.     Sensory: Sensation is intact.     Motor: Motor function is intact.     Coordination: Coordination is intact.     Gait: Gait is intact.     Deep Tendon Reflexes: Reflexes are normal and symmetric. Babinski sign absent on the right side. Babinski sign absent on the left side.     Comments: Normal kernig exam     ASSESSMENT/PLAN:   Tension/Stress Headache: Headache diary keeping discussed, patient can return to PCP with this if her HA does not improve. Although the patient had been on Straterra and Buspar for a while, this could S/E to meds. Plan to discuss med adjustment with PCP if HA persists. In the meantime, I started her on Ibuprofen prn and Magnesium oxide. Return precaution discussed.  POC Hemoglobin was checked at the patient's request for dizziness, given hx of anemia. I will contact the patient soon with the result.   07/20/2021, MD [facility]

## 2021-05-27 NOTE — Patient Instructions (Signed)
°Tension Headache, Adult °A tension headache is a feeling of pain, pressure, or aching in the head. It is often felt over the front and sides of the head. Tension headaches can last from 30 minutes to several days. °What are the causes? °The cause of this condition is not known. Sometimes, tension headaches are brought on by stress, worry (anxiety), or depression. Other things that may set them off include: °Alcohol. °Too much caffeine or caffeine withdrawal. °Colds, flu, or sinus infections. °Dental problems. This can include clenching your teeth. °Being tired. °Holding your head and neck in the same position for a long time, such as while using a computer. °Smoking. °Arthritis in the neck. °What are the signs or symptoms? °Feeling pressure around the head. °A dull ache in the head. °Pain over the front and sides of the head. °Feeling sore or tender in the muscles of the head, neck, and shoulders. °How is this treated? °This condition may be treated with lifestyle changes and with medicines that help relieve symptoms. °Follow these instructions at home: °Managing pain °Take over-the-counter and prescription medicines only as told by your doctor. °When you have a headache, lie down in a dark, quiet room. °If told, put ice on your head and neck. To do this: °Put ice in a plastic bag. °Place a towel between your skin and the bag. °Leave the ice on for 20 minutes, 2-3 times a day. °Take off the ice if your skin turns bright red. This is very important. If you cannot feel pain, heat, or cold, you have a greater risk of damage to the area. °If told, put heat on the back of your neck. Do this as often as told by your doctor. Use the heat source that your doctor recommends, such as a moist heat pack or a heating pad. °Place a towel between your skin and the heat source. °Leave the heat on for 20-30 minutes. °Take off the heat if your skin turns bright red. This is very important. If you cannot feel pain, heat, or cold,  you have a greater risk of getting burned. °Eating and drinking °Eat meals on a regular schedule. °If you drink alcohol: °Limit how much you have to: °0-1 drink a day for women who are not pregnant. °0-2 drinks a day for men. °Know how much alcohol is in your drink. In the [place] one drink equals one 12 oz bottle of beer (355 mL), one 5 oz glass of wine (148 mL), or one 1½ oz glass of hard liquor (44 mL). °Drink enough fluid to keep your pee (urine) pale yellow. °Do not use a lot of caffeine, or stop using caffeine. °Lifestyle °Get 7-9 hours of sleep each night. Or get the amount of sleep that your doctor tells you to. °At bedtime, keep computers, phones, and tablets out of your room. °Find ways to lessen your stress. This may include: °Exercise. °Deep breathing. °Yoga. °Listening to music. °Thinking positive thoughts. °Sit up straight. Try to relax your muscles. °Do not smoke or use any products that contain nicotine or tobacco. If you need help quitting, ask your doctor. °General instructions ° °Avoid things that can bring on headaches. Keep a headache journal to see what may bring on headaches. For example, write down: °What you eat and drink. °How much sleep you get. °Any change to your diet or medicines. °Keep all follow-up visits. °Contact a doctor if: °Your headache does not get better. °Your headache comes back. °You have a headache, and sounds,   light, or smells bother you. °You feel like you may vomit, or you vomit. °Your stomach hurts. °Get help right away if: °You all of a sudden get a very bad headache with any of these things: °A stiff neck. °Feeling like you may vomit. °Vomiting. °Feeling mixed up (confused). °Feeling weak in one part or one side of your body. °Having trouble seeing or speaking, or both. °Feeling short of breath. °A rash. °Feeling very sleepy. °Pain in your eye or ear. °Trouble walking or balancing. °Feeling like you will faint, or you faint. °Summary °A tension headache is pain,  pressure, or aching in your head. °Tension headaches can last from 30 minutes to several days. °Lifestyle changes and medicines may help relieve pain. °This information is not intended to replace advice given to you by your health care provider. Make sure you discuss any questions you have with your health care provider. °Document Revised: 02/05/2020 Document Reviewed: 02/05/2020 °Elsevier Patient Education © 2022 [employer]vier Inc. ° °

## 2021-06-03 DIAGNOSIS — F909 Attention-deficit hyperactivity disorder, unspecified type: Secondary | ICD-10-CM | POA: Diagnosis not present

## 2021-06-05 NOTE — Progress Notes (Deleted)
° ° °  SUBJECTIVE:   CHIEF COMPLAINT / HPI:   Discuss Pap Smear    Discuss Surgery     PERTINENT  PMH / PSH: Anxiety, Iron-deficiency anemia   OBJECTIVE:   LMP 05/22/2021  ***  General: NAD, pleasant, able to participate in exam Cardiac: RRR, no murmurs. Respiratory: CTAB, normal effort, No wheezes, rales or rhonchi Abdomen: Bowel sounds present, nontender, nondistended, no hepatosplenomegaly. Extremities: no edema or cyanosis. Skin: warm and dry, no rashes noted Neuro: alert, no obvious focal deficits Psych: Normal affect and mood  ASSESSMENT/PLAN:   No problem-specific Assessment & Plan notes found for this encounter.     Sharion Settler, [place]

## 2021-06-07 ENCOUNTER — Ambulatory Visit: Payer: Medicaid Other | Admitting: Family Medicine

## 2021-06-14 NOTE — Patient Instructions (Incomplete)
It was wonderful to see you today. ? ?Please bring ALL of your medications with you to every visit.  ? ?Today we talked about: ? ?** ? ? ?Thank you for choosing [facility].  ? ?Please call [phone] with any questions about today's appointment. ? ?Please be sure to schedule follow up at the front  desk before you leave today.  ? ?Zi Newbury, DO ?PGY-2 Family Medicine   ?

## 2021-06-14 NOTE — Progress Notes (Deleted)
° ° °  SUBJECTIVE:   CHIEF COMPLAINT / HPI:   Discuss Pap Smear  Diane Zuniga is a 21 y.o. adult who presents to the [facility] clinic to discuss Pap smear.   Discuss Surgery  A referral to plastic surgery was placed in November for mastectomy.    PERTINENT  PMH / PSH: Anxiety, Iron-deficiency anemia   OBJECTIVE:   LMP 05/22/2021  ***  General: NAD, pleasant, able to participate in exam Cardiac: RRR, no murmurs. Respiratory: CTAB, normal effort, No wheezes, rales or rhonchi Abdomen: Bowel sounds present, nontender, nondistended, no hepatosplenomegaly. Extremities: no edema or cyanosis. Skin: warm and dry, no rashes noted Neuro: alert, no obvious focal deficits Psych: Normal affect and mood  ASSESSMENT/PLAN:   No problem-specific Assessment & Plan notes found for this encounter.     Sabino Dick, DO [facility]

## 2021-06-15 ENCOUNTER — Ambulatory Visit: Payer: Medicaid Other | Admitting: Family Medicine

## 2021-06-24 DIAGNOSIS — F840 Autistic disorder: Secondary | ICD-10-CM | POA: Diagnosis not present

## 2021-07-18 DIAGNOSIS — F909 Attention-deficit hyperactivity disorder, unspecified type: Secondary | ICD-10-CM | POA: Diagnosis not present

## 2021-07-26 DIAGNOSIS — F640 Transsexualism: Secondary | ICD-10-CM | POA: Diagnosis not present

## 2021-07-27 DIAGNOSIS — F909 Attention-deficit hyperactivity disorder, unspecified type: Secondary | ICD-10-CM | POA: Diagnosis not present

## 2021-08-16 DIAGNOSIS — F909 Attention-deficit hyperactivity disorder, unspecified type: Secondary | ICD-10-CM | POA: Diagnosis not present

## 2021-09-05 ENCOUNTER — Encounter: Payer: Self-pay | Admitting: Family Medicine

## 2021-09-05 ENCOUNTER — Ambulatory Visit: Payer: Medicaid Other | Admitting: Family Medicine

## 2021-09-05 VITALS — BP 100/60 | HR 76 | Ht 70.0 in | Wt 216.1 lb

## 2021-09-05 DIAGNOSIS — M7651 Patellar tendinitis, right knee: Secondary | ICD-10-CM | POA: Diagnosis present

## 2021-09-05 NOTE — Progress Notes (Signed)
? ? ?  SUBJECTIVE:  ? ?CHIEF COMPLAINT / HPI:  ? ?"Possible dislocated knee": ?21 year old presenting for concern of knee pain. Diane Zuniga states on Friday was at work on his knees on a hard floor when he got a sudden pop and pain. He was able to stand and walk.  He was having some pain and possibly some swelling in the right knee.  Because the doctor's office was closed he was not able to make an appointment.  He states that since then has been feeling some clicking which she has not noticed before as well as some pain with walking or going uphill.  Denies any catching of the knee or locking into place. ? ?PERTINENT  PMH / PSH: None relevant ? ?OBJECTIVE:  ? ?BP 100/60   Pulse 76   Ht 5\' 10"  (1.778 m)   Wt 216 lb 2 oz (98 kg)   LMP 08/24/2021   SpO2 99%   BMI 31.01 kg/m?   ? ?General: NAD, pleasant, able to participate in exam ?Respiratory: No respiratory distress ?MSK: Knee, right: Inspection was negative for erythema, ecchymosis, and effusion. No obvious bony abnormalities or signs of osteophyte development. Palpation yielded no asymmetric warmth; No joint line tenderness; No condyle tenderness; minimal patellar tenderness; No patellar crepitus. Patellar tendon with some discomfort to palpation.  This was worsened when patient was at extension against resistance.  No pain with palpation of quadriceps tendon. ROM normal in flexion (135 degrees) and extension (0 degrees).  Normal varus and valgus testing, no anterior posterior drawer movement or pain, negative for McMurray's.  Neurovascularly intact bilaterally.  ?Psych: Normal affect and mood ? ?ASSESSMENT/PLAN:  ? ?Patellar tendinitis: ?Occurred in the setting of patient being on their knees at work against hardwood floors without using knee pads.  Physical exam with minor discomfort when palpating the patella but moderate discomfort when palpating the patellar tendon.  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  No indication for  imaging at this time with no set trauma to the patella and only minor discomfort to palpation.  Recommended using ibuprofen +/- Voltaren gel on the right knee.  Recommended ultimately some quadricep strengthening exercises would likely benefit the patient but I recommended getting the patellar tendinitis improve prior to going this route.  Recommended using kneepads while at work with the knees resting on the floor.  I did give her a note recommending to avoid this for the next 2 or 3 days.  Follow-up in 2 weeks to see how he is doing or sooner if pain worsens. ?  ?Lurline Del, DO ?[place]  ?

## 2021-09-05 NOTE — Patient Instructions (Signed)
For your knee pain your symptoms are consistent with patellar tendinitis.  I recommend using some Voltaren also called diclofenac gel.  You can also use ibuprofen 400 mg every 6 hours for the next 2 or 3 days to help with the discomfort.  I would like to see back in 2 weeks if your symptoms are not improving or sooner if they worsen.  Ultimately physical therapy may help you in the future to strengthen your quadricep muscles but I would wait until this is improving before we go that route.  Let me know if you have further questions. ?

## 2021-10-03 NOTE — Progress Notes (Signed)
    SUBJECTIVE:   CHIEF COMPLAINT / HPI:   Painful Periods Diane Zuniga is a 21 y.o. adult who presents to the clinic today to discuss painful periods.  States has been ongoing since he started having periods at the age of 60.  Periods occur monthly, lasting 8 to 9 days.  They are heavy for the first 3 days as well as most painful the first 3 days.  He has tried using Tylenol, ibuprofen, hot/cold packs.  Occasionally gets so bad that he vomits and passes out.  He is considered calling an ambulance in the past due to how painful they are.  He is not sexually active and has never had vaginal intercourse.  No history of migraines, no family history of breast cancer, no personal history of blood clots. He is a non-smoker.   PERTINENT  PMH / PSH:  Past Medical History:  Diagnosis Date   Anxiety    Back pain 03/31/2020   COVID-19 vaccine administered 05/26/2020   Family history of cardiomyopathy    Family history of prostate cancer    Family history of uterine cancer    Left knee pain 03/31/2020   Palpitations     OBJECTIVE:   BP 120/79   Pulse 97   Ht 5\' 10"  (1.778 m)   Wt 209 lb 4 oz (94.9 kg)   LMP 09/28/2021   SpO2 98%   BMI 30.02 kg/m    General: NAD, pleasant, able to participate in exam Respiratory: Normal respiratory effort Psych: Normal affect and mood  ASSESSMENT/PLAN:   Painful menstrual periods Ongoing for several years.  We discussed different contraceptive options that could help regulate periods, help with flow and cramping. Patient elected to try OCP. -Start Junel  -F/u in 8 weeks for BP check; sooner if not tolerating 2/2 adverse effects  Healthcare maintenance Patient is of the age where we would consider starting Pap smears, however he has never had vaginal intercourse.  Not indicated at this time.  Discussed recommendation with patient should he have intercourse, patient aware.     Sharion Settler, [place]

## 2021-10-06 ENCOUNTER — Encounter: Payer: Self-pay | Admitting: Family Medicine

## 2021-10-06 ENCOUNTER — Ambulatory Visit (INDEPENDENT_AMBULATORY_CARE_PROVIDER_SITE_OTHER): Payer: Medicaid Other | Admitting: Family Medicine

## 2021-10-06 VITALS — BP 120/79 | HR 97 | Ht 70.0 in | Wt 209.2 lb

## 2021-10-06 DIAGNOSIS — Z0000 Encounter for general adult medical examination without abnormal findings: Secondary | ICD-10-CM | POA: Diagnosis not present

## 2021-10-06 DIAGNOSIS — N946 Dysmenorrhea, unspecified: Secondary | ICD-10-CM | POA: Diagnosis present

## 2021-10-06 MED ORDER — NORETHINDRONE ACET-ETHINYL EST 1-20 MG-MCG PO TABS: 1.0000 | ORAL_TABLET | Freq: Every day | ORAL | 11 refills | Status: DC

## 2021-10-06 NOTE — Patient Instructions (Signed)
It was wonderful to see you today.  Please bring ALL of your medications with you to every visit.   Today we talked about:  -We are starting you on birth control pills to help regulate and calm your periods. -Come back in 2 months for a blood pressure check! -If you are having any significant side effects, please come see me sooner.  Thank you for choosing [facility].   Please call [phone] with any questions about today's appointment.  Please be sure to schedule follow up at the front  desk before you leave today.   Sabino Dick, DO PGY-2 Family Medicine

## 2021-10-06 NOTE — Assessment & Plan Note (Signed)
Patient is of the age where we would consider starting Pap smears, however he has never had vaginal intercourse.  Not indicated at this time.  Discussed recommendation with patient should he have intercourse, patient aware.

## 2021-10-06 NOTE — Assessment & Plan Note (Signed)
Ongoing for several years.  We discussed different contraceptive options that could help regulate periods, help with flow and cramping. Patient elected to try OCP. -Start Junel  -F/u in 8 weeks for BP check; sooner if not tolerating 2/2 adverse effects

## 2021-10-12 ENCOUNTER — Other Ambulatory Visit: Payer: Self-pay

## 2021-10-12 MED ORDER — BUSPIRONE HCL 10 MG PO TABS: 10.0000 mg | ORAL_TABLET | Freq: Two times a day (BID) | ORAL | 3 refills | Status: DC

## 2021-10-12 MED ORDER — FLUOXETINE HCL 20 MG PO TABS: 20.0000 mg | ORAL_TABLET | Freq: Every day | ORAL | 3 refills | Status: DC

## 2021-10-12 MED ORDER — BUPROPION HCL ER (XL) 300 MG PO TB24: 300.0000 mg | ORAL_TABLET | Freq: Every day | ORAL | 3 refills | Status: AC

## 2021-10-13 ENCOUNTER — Other Ambulatory Visit (HOSPITAL_COMMUNITY): Payer: Self-pay

## 2021-10-13 ENCOUNTER — Telehealth: Payer: Self-pay

## 2021-10-13 NOTE — Telephone Encounter (Signed)
Rec'd PA request from pharmacy for FLUOXETINE TABLETS.  Spoke with pharmacy and ok'd change to capsules (medicaid preferred).

## 2021-10-25 ENCOUNTER — Encounter: Payer: Self-pay | Admitting: *Deleted

## 2022-04-17 IMAGING — DX DG SCOLIOSIS EVAL COMPLETE SPINE 1V
1 series · 1 of 1 positions shown · non-contrast
Comparison: None.

CLINICAL DATA: Chronic back pain.  Evaluate for scoliosis.

EXAM:
DG SCOLIOSIS EVAL COMPLETE SPINE 1V

[dg scoliosis ap]
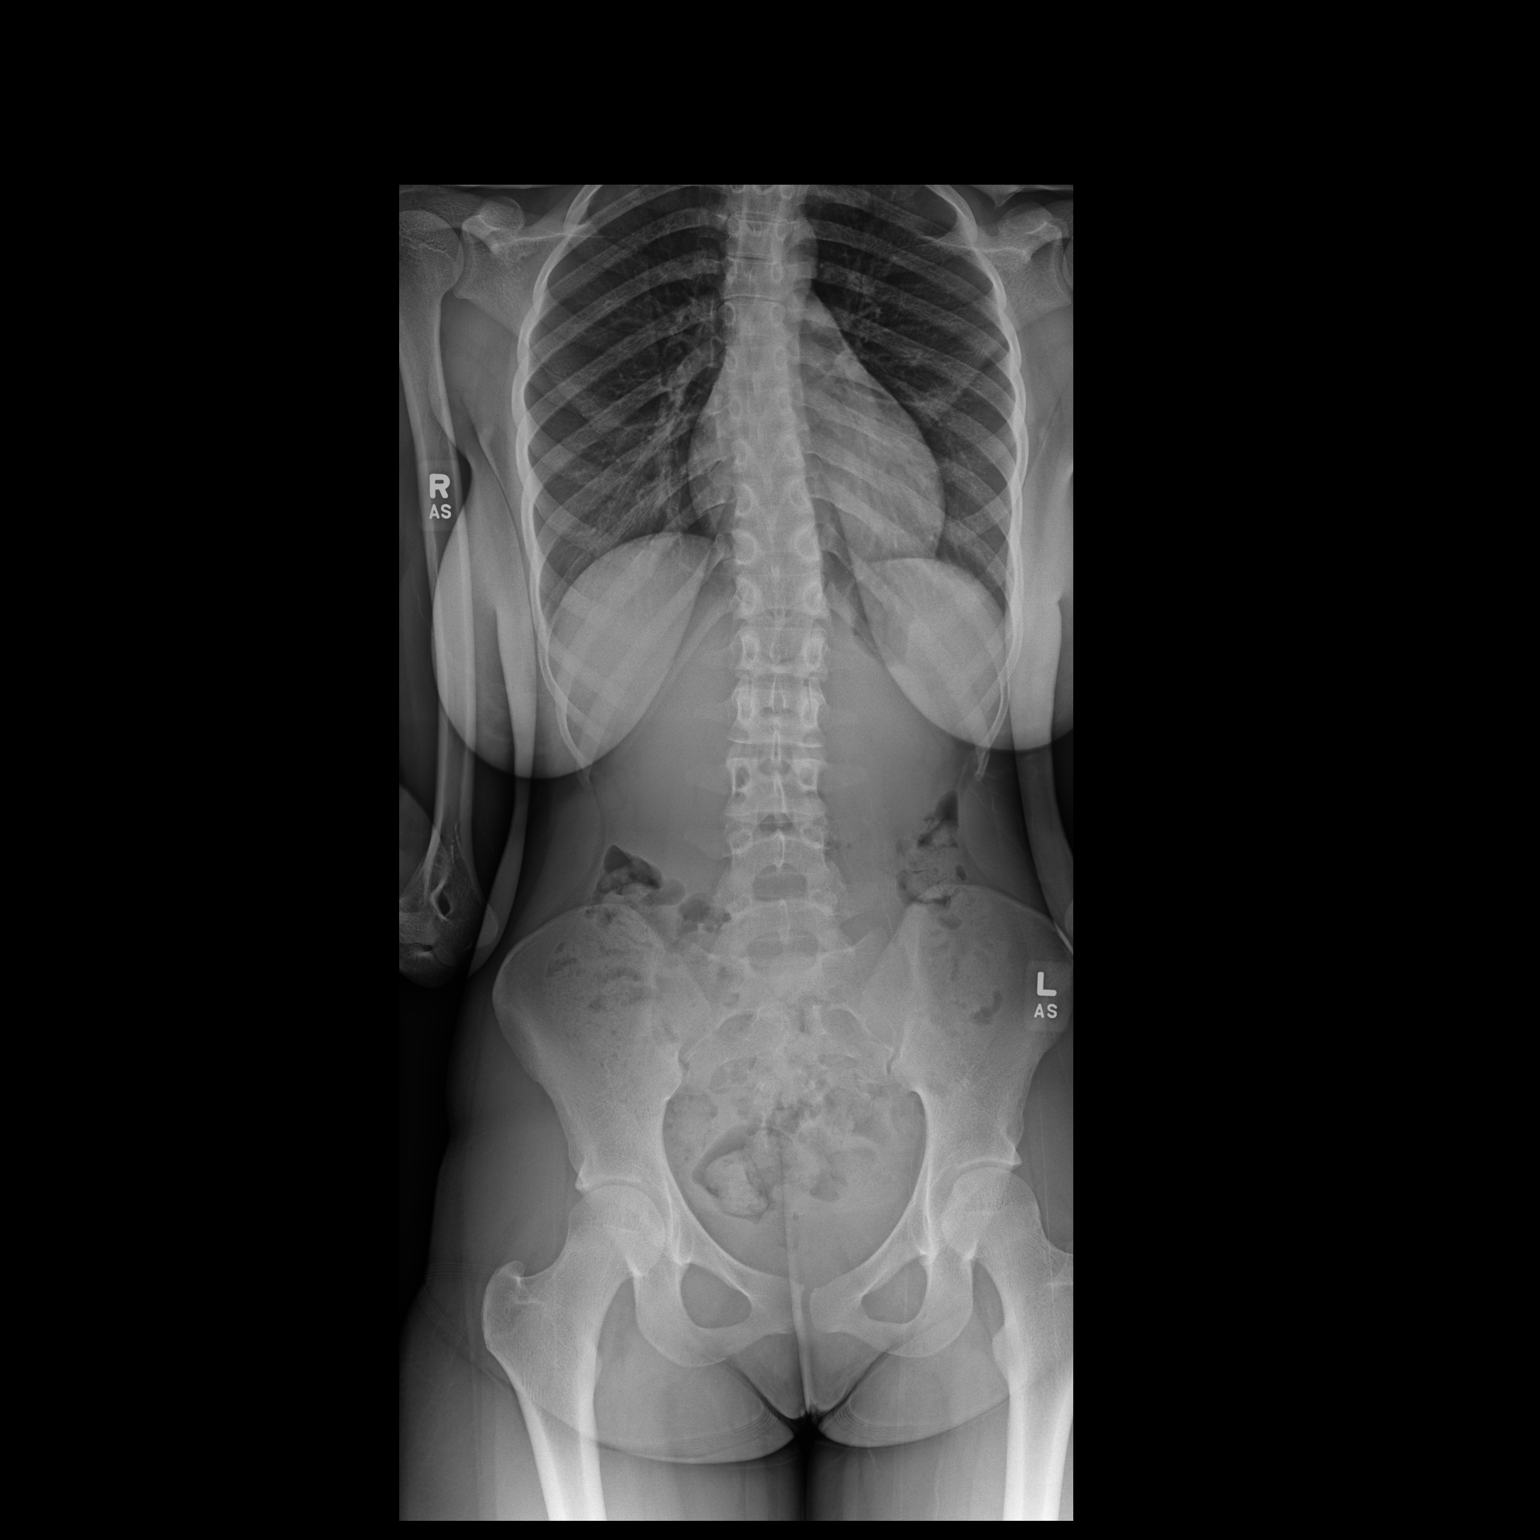

[1 of 1 positions shown; findings below may reference images not displayed]

FINDINGS: Normal heart size and clear lungs. Nonobstructive bowel gas pattern,
without abnormal abdominopelvic calcifications.

Mild S shaped thoracolumbar spine curvature.

From the top of T2 through the bottom of T12, convex right curvature
of approximately 11 degrees. From name the top of T7 through the
bottom of L5, convex left approximately 4 degrees. No segmentation
anomaly.
IMPRESSION: Mild S shaped thoracolumbar spine curvature, as detailed above.

## 2022-04-20 ENCOUNTER — Emergency Department (HOSPITAL_COMMUNITY): Payer: Medicaid Other

## 2022-04-20 ENCOUNTER — Emergency Department (HOSPITAL_COMMUNITY)
Admission: EM | Admit: 2022-04-20 | Discharge: 2022-04-20 | Disposition: A | Discharge status: Home / Self Care | Payer: Self-pay | Attending: Emergency Medicine | Admitting: Emergency Medicine

## 2022-04-20 DIAGNOSIS — S8001XA Contusion of right knee, initial encounter: Secondary | ICD-10-CM | POA: Diagnosis not present

## 2022-04-20 DIAGNOSIS — M25561 Pain in right knee: Secondary | ICD-10-CM | POA: Diagnosis present

## 2022-04-20 DIAGNOSIS — Y92410 Unspecified street and highway as the place of occurrence of the external cause: Secondary | ICD-10-CM | POA: Diagnosis not present
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
## 2022-06-01 ENCOUNTER — Telehealth: Payer: Medicaid Other | Admitting: Family Medicine

## 2022-06-01 ENCOUNTER — Ambulatory Visit (HOSPITAL_COMMUNITY)
Admission: EM | Admit: 2022-06-01 | Discharge: 2022-06-01 | Disposition: A | Discharge status: Other Acute Inpatient Hospital | Payer: Medicaid Other

## 2022-06-01 DIAGNOSIS — T50901A Poisoning by unspecified drugs, medicaments and biological substances, accidental (unintentional), initial encounter: Secondary | ICD-10-CM

## 2022-06-01 NOTE — Progress Notes (Signed)
Virtual Visit Consent   Diane Zuniga, you are scheduled for a virtual visit with a [facility] provider today. Just as with appointments in the office, your consent must be obtained to participate. Your consent will be active for this visit and any virtual visit you may have with one of our providers in the next 365 days. If you have a MyChart account, a copy of this consent can be sent to you electronically.  As this is a virtual visit, video technology does not allow for your provider to perform a traditional examination. This may limit your provider's ability to fully assess your condition. If your provider identifies any concerns that need to be evaluated in person or the need to arrange testing (such as labs, EKG, etc.), we will make arrangements to do so. Although advances in technology are sophisticated, we cannot ensure that it will always work on either your end or our end. If the connection with a video visit is poor, the visit may have to be switched to a telephone visit. With either a video or telephone visit, we are not always able to ensure that we have a secure connection.  By engaging in this virtual visit, you consent to the provision of healthcare and authorize for your insurance to be billed (if applicable) for the services provided during this visit. Depending on your insurance coverage, you may receive a charge related to this service.  I need to obtain your verbal consent now. Are you willing to proceed with your visit today? Diane Zuniga has provided verbal consent on 06/01/2022 for a virtual visit (video or telephone). Diane Mayo, NP  Date: 06/01/2022 10:44 AM  Virtual Visit via Video Note   I, Diane Zuniga, connected with  Diane Zuniga  ([id number], [birth year]) on 06/01/22 at 10:45 AM EST by a video-enabled telemedicine application and verified that I am speaking with the correct person using two identifiers.  Location: Patient: Virtual Visit Location  Patient: Home Provider: Virtual Visit Location Provider: Home Office   I discussed the limitations of evaluation and management by telemedicine and the availability of in person appointments. The patient expressed understanding and agreed to proceed.    History of Present Illness: Diane Zuniga is a 22 y.o. who identifies as a female who was assigned adult at birth, and is being seen today for accidental OD of tylenol overnight.  On cycle- took 1,00 mg of Tylenol at 2 am related to pain. Fell back to sleep- woke around an hour later and had forgotten she had take the Tylenol and repeated the dose at 3 am. Now reports bad stomach pains, pin and needles inside stomach sensation. Did have nausea but that improved.    Problems:  Patient Active Problem List   Diagnosis Date Noted   Painful menstrual periods 10/06/2021   Healthcare maintenance 10/06/2021   Abnormality of gait 02/03/2021   Genetic testing 12/05/2020   Family history of prostate cancer 11/19/2020   Family history of uterine cancer 10/28/2020   Iron deficiency anemia 09/29/2020   Weakness of both hands 09/16/2020   Family history of cardiomyopathy 04/30/2020   Anxiety 04/30/2020    Allergies: No Known Allergies Medications:  Current Outpatient Medications:    buPROPion (WELLBUTRIN XL) 300 MG 24 hr tablet, Take 1 tablet (300 mg total) by mouth daily., Disp: 90 tablet, Rfl: 3   busPIRone (BUSPAR) 10 MG tablet, Take 1 tablet (10 mg total) by mouth in the morning and at bedtime., Disp: 60 tablet,  Rfl: 3   FLUoxetine (PROZAC) 20 MG tablet, Take 1 tablet (20 mg total) by mouth daily., Disp: 90 tablet, Rfl: 3   hydrOXYzine (ATARAX) 25 MG tablet, Take 25 mg by mouth once. Take 1 tablet as needed., Disp: , Rfl:    norethindrone-ethinyl estradiol (JUNEL 1/20) 1-20 MG-MCG tablet, Take 1 tablet by mouth daily., Disp: 28 tablet, Rfl: 11  Observations/Objective: Patient is well-developed, well-nourished in no acute distress.  Resting  comfortably  at home.  Head is normocephalic, atraumatic.  No labored breathing.  Speech is clear and coherent with logical content.  Patient is alert and oriented at baseline.    Assessment and Plan:  1. Accidental overdose, initial encounter   Given symptoms and overdose- in person is recommended Visit stopped so patient could go to local UC for in person care   Follow Up Instructions: I discussed the assessment and treatment plan with the patient. The patient was provided an opportunity to ask questions and all were answered. The patient agreed with the plan and demonstrated an understanding of the instructions.  A copy of instructions were sent to the patient via MyChart unless otherwise noted below.    The patient was advised to call back or seek an in-person evaluation if the symptoms worsen or if the condition fails to improve as anticipated.  Time:  I spent 10 minutes with the patient via telehealth technology discussing the above problems/concerns.    Diane Mayo, NP

## 2022-06-01 NOTE — Patient Instructions (Signed)
  Diane Zuniga, thank you for joining Diane Mayo, NP for today's virtual visit.  While this provider is not your primary care provider (PCP), if your PCP is located in our provider database this encounter information will be shared with them immediately following your visit.   [facility] account gives you access to today's visit and all your visits, tests, and labs performed at [facility] " click here if you don't have a [facility] account or go to [email or web address]  Consent: (Patient) Diane Zuniga provided verbal consent for this virtual visit at the beginning of the encounter.  Current Medications:  Current Outpatient Medications:    buPROPion (WELLBUTRIN XL) 300 MG 24 hr tablet, Take 1 tablet (300 mg total) by mouth daily., Disp: 90 tablet, Rfl: 3   busPIRone (BUSPAR) 10 MG tablet, Take 1 tablet (10 mg total) by mouth in the morning and at bedtime., Disp: 60 tablet, Rfl: 3   FLUoxetine (PROZAC) 20 MG tablet, Take 1 tablet (20 mg total) by mouth daily., Disp: 90 tablet, Rfl: 3   hydrOXYzine (ATARAX) 25 MG tablet, Take 25 mg by mouth once. Take 1 tablet as needed., Disp: , Rfl:    norethindrone-ethinyl estradiol (JUNEL 1/20) 1-20 MG-MCG tablet, Take 1 tablet by mouth daily., Disp: 28 tablet, Rfl: 11   Medications ordered in this encounter:  No orders of the defined types were placed in this encounter.    *If you need refills on other medications prior to your next appointment, please contact your pharmacy*  Follow-Up: Call back or seek an in-person evaluation if the symptoms worsen or if the condition fails to improve as anticipated.  [facility] [phone]  Other Instructions  Please be seen in person for possible labs and in person assessment    If you have been instructed to have an in-person evaluation today at a local Urgent Care facility, please use the link below. It will take you to a list of all of our  available [facility], including address, phone number and hours of operation. Please do not delay care.  [facility]  If you or a family member do not have a primary care provider, use the link below to schedule a visit and establish care. When you choose a [facility] primary care physician or advanced practice provider, you gain a long-term partner in health. Find a Primary Care Provider  Learn more about [facility] in-office and virtual care options: [facility]

## 2022-06-23 ENCOUNTER — Encounter: Payer: Self-pay | Admitting: Family Medicine

## 2022-06-23 ENCOUNTER — Ambulatory Visit (INDEPENDENT_AMBULATORY_CARE_PROVIDER_SITE_OTHER): Payer: Medicaid Other | Admitting: Family Medicine

## 2022-06-23 VITALS — BP 108/68 | HR 104 | Ht 70.0 in | Wt 209.0 lb

## 2022-06-23 DIAGNOSIS — Z23 Encounter for immunization: Secondary | ICD-10-CM | POA: Diagnosis not present

## 2022-06-23 DIAGNOSIS — N3000 Acute cystitis without hematuria: Secondary | ICD-10-CM | POA: Diagnosis present

## 2022-06-23 DIAGNOSIS — R300 Dysuria: Secondary | ICD-10-CM | POA: Diagnosis not present

## 2022-06-23 LAB — POCT URINALYSIS DIP (MANUAL ENTRY)
Bilirubin, UA: NEGATIVE
Blood, UA: NEGATIVE
Glucose, UA: NEGATIVE mg/dL
Ketones, POC UA: NEGATIVE mg/dL
Nitrite, UA: NEGATIVE
Protein Ur, POC: NEGATIVE mg/dL
Spec Grav, UA: 1.015 (ref 1.010–1.025)
Urobilinogen, UA: 0.2 E.U./dL
pH, UA: 7 (ref 5.0–8.0)

## 2022-06-23 LAB — POCT UA - MICROSCOPIC ONLY: WBC, Ur, HPF, POC: >20 (ref 0–5)

## 2022-06-23 MED ORDER — CEPHALEXIN 500 MG PO CAPS: 500.0000 mg | ORAL_CAPSULE | Freq: Two times a day (BID) | ORAL | 0 refills | Status: AC

## 2022-06-23 NOTE — Progress Notes (Unsigned)
    SUBJECTIVE:   CHIEF COMPLAINT / HPI:   Dysuria Patient reports two weeks of worsening symptoms and is concerned for a UTI. At first, only experienced some painful urination. But slowly progressed to dysuria, burning with urination, frequency, difficulty emptying, and small volume incontinence. Has been trying azo without much relief.   Denies fever or chills. Never sexually active, no concern for STI.   PERTINENT  PMH / PSH:  Patient Active Problem List   Diagnosis Date Noted   Acute cystitis without hematuria 06/26/2022   Need for Tdap vaccination 06/26/2022   Painful menstrual periods 10/06/2021   Healthcare maintenance 10/06/2021   Abnormality of gait 02/03/2021   Genetic testing 12/05/2020   Family history of prostate cancer 11/19/2020   Family history of uterine cancer 10/28/2020   Iron deficiency anemia 09/29/2020   Weakness of both hands 09/16/2020   Family history of cardiomyopathy 04/30/2020   Anxiety 04/30/2020    OBJECTIVE:   BP 108/68 (BP Location: Left Arm, Patient Position: Sitting, Cuff Size: Large)   Pulse (!) 104   Ht 5\' 10"  (1.778 m)   Wt 209 lb (94.8 kg)   LMP 05/30/2022 (Exact Date)   SpO2 98%   BMI 29.99 kg/m     PHQ-9:     06/23/2022    3:58 PM 10/06/2021    2:35 PM 09/05/2021   10:43 AM  Depression screen PHQ 2/9  Decreased Interest 1 1 2   Down, Depressed, Hopeless 1 1 1   PHQ - 2 Score 2 2 3   Altered sleeping 2 2 3   Tired, decreased energy 2 2 3   Change in appetite 1 2 3   Feeling bad or failure about yourself  1 0 1  Trouble concentrating 1 1 1   Moving slowly or fidgety/restless 0 0 1  Suicidal thoughts 0 0 0  PHQ-9 Score 9 9 15   Difficult doing work/chores Somewhat difficult Somewhat difficult Somewhat difficult    Physical Exam General: Awake, alert, oriented Cardiovascular: Regular rate and rhythm, S1 and S2 present, no murmurs auscultated Respiratory: Lung fields clear to auscultation bilaterally MSK: no CVA  tenderness  ASSESSMENT/PLAN:   Acute cystitis without hematuria History and UA c/w uncomplicated UTI. Rx keflex BID x 5 days. Return precautions given.   Need for Tdap vaccination Tolerated well without adverse side effects. Next in 10 years.      Ezequiel Essex, MD [facility]

## 2022-06-23 NOTE — Patient Instructions (Signed)
It was wonderful to see you today. Thank you for allowing me to be a part of your care. Below is a short summary of what we discussed at your visit today:  UTI Today we collected a urine sample to look for urine infection.  I am treating you for a UTI. Take Keflex twice daily for 5 days.   PAP smear Given your age, it is time to start doing PAP smears to screen for cervical cancer. If the results look normal, you'll generally have them every 3 to 5 years.   Please schedule at the front desk on your way out.   Vaccines Today you received the TDaP. You may experience some residual soreness at the injection site.  Gentle stretches and regular use of that arm will help speed up your recovery.  As the vaccines are giving your immune system a "practice run" against specific infections, you may feel a little under the weather for the next several days.  We recommend rest as needed and hydrating.  Today you declined the annual flu vaccine and COVID vaccine.  If you change your mind at any time, you may simply call the clinic and make a vaccine only appointment with a nurse at your convenience.  We recommend completing the two-shot COVID vaccine once and also getting your flu shot every year.  While it does not fully prevent you from getting the flu or COVID, it does reduce symptom severity and keep you out of the hospital.  Please get records of your HPV vaccines from your pediatrician's office.   Please bring all of your medications to every appointment!  If you have any questions or concerns, please do not hesitate to contact us via phone or MyChart message.   Ezequiel Essex, MD

## 2022-06-26 DIAGNOSIS — N3000 Acute cystitis without hematuria: Secondary | ICD-10-CM | POA: Insufficient documentation

## 2022-06-26 DIAGNOSIS — Z23 Encounter for immunization: Secondary | ICD-10-CM | POA: Insufficient documentation

## 2022-06-26 NOTE — Assessment & Plan Note (Signed)
History and UA c/w uncomplicated UTI. Rx keflex BID x 5 days. Return precautions given.

## 2022-06-26 NOTE — Assessment & Plan Note (Signed)
Tolerated well without adverse side effects. Next in 10 years.

## 2022-07-12 ENCOUNTER — Ambulatory Visit: Payer: Medicaid Other | Admitting: Sports Medicine

## 2022-07-12 VITALS — BP 110/72 | Ht 70.0 in | Wt 210.0 lb

## 2022-07-12 DIAGNOSIS — R269 Unspecified abnormalities of gait and mobility: Secondary | ICD-10-CM

## 2022-07-12 NOTE — Progress Notes (Signed)
  Diane Zuniga - 23 y.o. adult MRN [id number]  Date of birth: May 06, 2001    CHIEF COMPLAINT:   Orthotics    SUBJECTIVE:   HPI:  Pleasant 22 year old comes to clinic to be fitted for new orthotics.  The patient had orthotics made last around 2022.  Patient really enjoyed the orthotics and had good pain relief from that and is here to get new ones fitted as these are now worn out.  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  Patient was fitted for a : standard, cushioned, semi-rigid orthotic. The orthotic was heated and afterward the patient stood on the orthotic blank positioned on the orthotic stand. The patient was positioned in subtalar neutral position and 10 degrees of ankle dorsiflexion in a weight bearing stance. After completion of molding, a stable base was applied to the orthotic blank. The blank was ground to a stable position for weight bearing. Size: 9 Base: blue EVA Posting: none Additional orthotic padding: left heel wedge 3/16th in  Gait was neutral with orthotics in place. Patient found them to be comfortable. Follow-up as needed.    Dortha Kern, MD PGY-4, Sports Medicine Fellow [facility]  Addendum:  I was the preceptor for this visit and available for immediate consultation.  Karlton Lemon MD K[facility] Boys

## 2022-08-13 NOTE — Progress Notes (Addendum)
    SUBJECTIVE:   CHIEF COMPLAINT / HPI:   Atiyana Herrero is a 22 y.o. adult who presents to the [facility] clinic today to discuss the following concerns:   Discuss Depression, Anxiety   PERTINENT  PMH / PSH: ***  OBJECTIVE:   There were no vitals taken for this visit.   General: NAD, pleasant, able to participate in exam Cardiac: RRR, no murmurs. Respiratory: CTAB, normal effort, No wheezes, rales or rhonchi Abdomen: Bowel sounds present, nontender, nondistended, no hepatosplenomegaly. Extremities: no edema or cyanosis. Skin: warm and dry, no rashes noted Neuro: alert, no obvious focal deficits Psych: Normal affect and mood  ASSESSMENT/PLAN:   No problem-specific Assessment & Plan notes found for this encounter.     Sharion Settler, [place]

## 2022-08-14 ENCOUNTER — Encounter: Payer: Self-pay | Admitting: Family Medicine

## 2022-08-14 ENCOUNTER — Ambulatory Visit (INDEPENDENT_AMBULATORY_CARE_PROVIDER_SITE_OTHER): Payer: Medicaid Other | Admitting: Family Medicine

## 2022-08-14 VITALS — BP 108/62 | HR 96 | Ht 70.0 in | Wt 207.8 lb

## 2022-08-14 DIAGNOSIS — F419 Anxiety disorder, unspecified: Secondary | ICD-10-CM | POA: Diagnosis present

## 2022-08-14 DIAGNOSIS — F32A Depression, unspecified: Secondary | ICD-10-CM | POA: Diagnosis not present

## 2022-08-14 MED ORDER — BUSPIRONE HCL 5 MG PO TABS: 5.0000 mg | ORAL_TABLET | Freq: Three times a day (TID) | ORAL | 0 refills | Status: DC

## 2022-08-14 MED ORDER — FLUOXETINE HCL 20 MG PO TABS: 20.0000 mg | ORAL_TABLET | Freq: Every day | ORAL | 0 refills | Status: DC

## 2022-08-14 NOTE — Patient Instructions (Addendum)
It was wonderful to see you today.  Please bring ALL of your medications with you to every visit.   Today we talked about:  We are restarting your Prozac and Buspar I will see you back in 2 weeks See below for therapy resources.  Things can get better!   Therapy and Counseling Resources Most providers on this list will take Medicaid. Patients with commercial insurance or Medicare should contact their insurance company to get a list of in network providers.  [employer] [employer] (takes children) Location 1: [address], [place], [place] [address] Location 2: [place], [place] [address] [place] ([place] speaking therapist available)(habla espanol)(take medicare and medicaid)  [address], [place], [place] [address], [place] [email or web address] [phone]  [facility]:Psychiatry and Counseling [address] [place], [place] [address] [place], [place], [place] [address]      [phone]  [facility] (spanish available) [place], [place] [address] [place] (take [facility] and medicare) [address], [place],  [address]       [phone]     [place] (virtual only) [phone]  [facility] [address], [place], [place]  Family Solutions:  [employer]. [place] [phone]  [facility]:  [place] [address] [phone]  [facility] (under & uninsured) [address] [place] [phone]    [email or web address]    [place] [phone] [address]  [place]    [phone]  [facility] [place]     Phone:  [phone]     [place]  [place] [address]      [place], [place] ext [place]: [place], [place], [place]   [place] ([place] therapist) [email or web address]  [place] [address]   [place] [address]    [phone]    [facility]   [address],  [place], [place]   [place]  [place]  [facility]  [address] [place], [place]        [phone]  Open Access/Walk In Clinic under & uninsured  [facility]  [address] [place], [place] Crisis [phone]  [facility] Service of the [place],  ([place])   [place], [place]: [phone]) 8:30 - 12; 1 - 2:30  Family Service of the [place],  [place], [place]    [phone]):8:30 - 12; 2 - 3PM  [facility] [employer],  [address],  [name]; [phone]):   Mon - Fri 8 AM - 5 PM  [facility] [place]  MWF 12:30 to 3:00 or call to schedule an appointment  [phone]  Specific Provider options Psychology Today  [email or web address] click on find a therapist  enter your zip code left side and select or tailor a therapist for your specific need.   [facility] Provider Directory [email or web address]  (Medicaid)   Follow all drop down to find a provider  Social  Support program [place] 336) [phone] or [email or web address] [address], [place], [place] Recovery support and educational   24- Hour Availability:   [facility]  [address] [place], [place] Crisis [phone]  Family Service of the [employer] [phone]  [place]  [phone]   [place]   [phone] (after hours)  Therapeutic Alternative/Mobile Crisis   [phone]  [place] National Suicide Hotline  [phone] Diamantina Monks)  Call 911 or go to emergency room  [facility]  [phone]);  [place] and [employer]  [phone]); [place], [place], [place], [place], [place], [place], [place]  If you are feeling suicidal or depression symptoms worsen please immediately go to:   If you are thinking about harming yourself or having thoughts of suicide, or if you know someone who is, seek help right away. If you are in crisis, make sure you are not left alone.  If someone else is in crisis, make sure he/she/they is not left alone  Call 988 OR 1-800-273-TALK TEXT "HOME" to [place]  24 Hour Availability for [place]  [address] [place], [place] Crisis [phone]   Other crisis resources:  Family Service of the [employer] (Domestic Violence, Rape & Victim Assistance [phone]  [facility] [place]    (ONLY from 8am-4pm)    [phone]  Therapeutic Alternative Mobile Crisis Unit (24/7)   [phone]  [place] National Suicide Hotline   3678338547 Diamantina Monks)    Thank you for coming to your visit as scheduled. We have had a large "no-show" problem lately, and this significantly limits our ability to see and care for patients. As a friendly reminder- if you cannot make your appointment please call to cancel. We do have a no show policy for those who do not cancel within 24 hours. Our policy is that if you miss or fail to cancel an appointment within 24 hours, 3 times in a 27-month period, you may be dismissed from our clinic.   Thank you for choosing [facility].   Please call [phone] with any questions about today's appointment.  Please be sure to schedule follow up at the front  desk before you leave today.    Sharion Settler, DO PGY-3 Family Medicine

## 2022-08-15 ENCOUNTER — Encounter: Payer: Self-pay | Admitting: Family Medicine

## 2022-08-28 ENCOUNTER — Ambulatory Visit (INDEPENDENT_AMBULATORY_CARE_PROVIDER_SITE_OTHER): Payer: Medicaid Other | Admitting: Family Medicine

## 2022-08-28 ENCOUNTER — Other Ambulatory Visit: Payer: Self-pay

## 2022-08-28 ENCOUNTER — Encounter: Payer: Self-pay | Admitting: Family Medicine

## 2022-08-28 DIAGNOSIS — F32A Depression, unspecified: Secondary | ICD-10-CM

## 2022-08-28 DIAGNOSIS — F419 Anxiety disorder, unspecified: Secondary | ICD-10-CM | POA: Diagnosis not present

## 2022-08-28 NOTE — Patient Instructions (Addendum)
It was wonderful to see you today.  Please bring ALL of your medications with you to every visit.   Today we talked about:  Continue your current doses of Buspar and Prozac.  Return in 2 weeks for a follow up.    I would encourage you to continue to look into therapists! I would encourage exercise! Start with 2 times a week (on your off days) for about 30 minutes to an hour. This can help your physical and mental health.  Thank you for coming to your visit as scheduled. We have had a large "no-show" problem lately, and this significantly limits our ability to see and care for patients. As a friendly reminder- if you cannot make your appointment please call to cancel. We do have a no show policy for those who do not cancel within 24 hours. Our policy is that if you miss or fail to cancel an appointment within 24 hours, 3 times in a 13-month period, you may be dismissed from our clinic.   Thank you for choosing [facility].   Please call [phone] with any questions about today's appointment.  Please be sure to schedule follow up at the front  desk before you leave today.   Sabino Dick, DO PGY-3 Family Medicine

## 2022-08-28 NOTE — Progress Notes (Signed)
    SUBJECTIVE:   CHIEF COMPLAINT / HPI:   Diane Zuniga is a 22 y.o. adult who presents to the [facility] clinic today to discuss the following concerns:   F/u Anxiety/Depression Patient was last seen on 3/25 for symptoms of depression and anxiety.  He was provided with a list of therapists and started on Prozac, BuSpar.  He returns today for follow-up.  He feels that the Prozac is not lasting the full day. Currently taking the medicine at night but feels less productive during the day. Previously was taking in the morning but felt like he would get into a slump by 3PM. Didn't like the way it made him feel when taking in the morning.  Also taking Buspar. Feels like this is helping with their anxiety.  Has not yet called for therapy. Feels it is hard to find time to with his work schedule. Still has list of therapists provided previously.  Has no thoughts of self harm. Feels like depression and anxiety have both improved since last visit but feels symptoms of depression are lingering still.   PERTINENT  PMH / PSH: Gender dysphoria  OBJECTIVE:   BP 123/68   Pulse 84   Ht 5\' 10"  (1.778 m)   Wt 204 lb 3.2 oz (92.6 kg)   LMP 07/31/2022   SpO2 100%   BMI 29.30 kg/m    General: NAD, pleasant, able to participate in exam Cardiac: RRR, no murmurs. Respiratory: CTAB, normal effort, No wheezes, rales or rhonchi Psych: Normal affect and mood     08/28/2022    3:31 PM 08/14/2022    3:05 PM 08/14/2022    3:04 PM  Depression screen PHQ 2/9  Decreased Interest 2  2  Down, Depressed, Hopeless 2  3  PHQ - 2 Score 4  5  Altered sleeping 3  2  Tired, decreased energy 2  2  Change in appetite 2  2  Feeling bad or failure about yourself  1  2  Trouble concentrating 1  2  Moving slowly or fidgety/restless 0  1  Suicidal thoughts 0  1  PHQ-9 Score 13  17  Difficult doing work/chores  Very difficult    ASSESSMENT/PLAN:   1. Anxiety and depression PHQ-9 slightly improved today compared to  last visit 2 weeks ago.  Seems to have noticed some improvement with Prozac.  Discussed that Prozac half-life is 4-6 days, most likely not feeling full effect as he has only been on it for 2 weeks.  Discussed it can take up to 6 weeks to feel full effect.  Shared decision making to continue current dose and monitor for now as opposed to increasing dose at this time. No SI.  -Encouraged him to continue with her therapist -Encouraged exercise -Continue Prozac 20 mg and Buspar 5 mg TID -Return in 2 weeks for f/u    Sabino Dick, DO [facility]

## 2022-09-04 ENCOUNTER — Ambulatory Visit (INDEPENDENT_AMBULATORY_CARE_PROVIDER_SITE_OTHER): Payer: Medicaid Other | Admitting: Family Medicine

## 2022-09-04 ENCOUNTER — Encounter: Payer: Self-pay | Admitting: Family Medicine

## 2022-09-04 VITALS — BP 110/58 | HR 86 | Ht 70.0 in | Wt 202.8 lb

## 2022-09-04 DIAGNOSIS — F419 Anxiety disorder, unspecified: Secondary | ICD-10-CM | POA: Diagnosis not present

## 2022-09-04 DIAGNOSIS — Z91018 Allergy to other foods: Secondary | ICD-10-CM

## 2022-09-04 MED ORDER — EPINEPHRINE 0.3 MG/0.3ML IJ SOAJ: 0.3000 mg | INTRAMUSCULAR | 1 refills | Status: AC | PRN

## 2022-09-04 NOTE — Progress Notes (Signed)
a 

## 2022-09-04 NOTE — Patient Instructions (Addendum)
It was wonderful to see you today.  Please bring ALL of your medications with you to every visit.   Today we talked about:  Your lungs sound good today. I think your reaction has called down, thankfully.  Take Claritin daily.   I have sent in an Epi pen to your pharmacy so you can have just in case your reaction is severe.  I have sent a referral to an allergist for testing.   Thank you for coming to your visit as scheduled. We have had a large "no-show" problem lately, and this significantly limits our ability to see and care for patients. As a friendly reminder- if you cannot make your appointment please call to cancel. We do have a no show policy for those who do not cancel within 24 hours. Our policy is that if you miss or fail to cancel an appointment within 24 hours, 3 times in a 74-month period, you may be dismissed from our clinic.   Thank you for choosing [facility].   Please call [phone] with any questions about today's appointment.  Please be sure to schedule follow up at the front  desk before you leave today.   Sabino Dick, DO PGY-3 Family Medicine

## 2022-09-04 NOTE — Progress Notes (Signed)
    SUBJECTIVE:   CHIEF COMPLAINT / HPI:   Diane Zuniga is a 22 y.o. adult who presents to the [facility] clinic today to discuss the following concerns:   Food Allergy States that last night he ordered [place] [address] corn at [employer] around midnight. Only had a little bit of it, but there was lime on it. He is allergic to citrus. Since then feels like it has been harder to breathe. No nausea or vomiting. No hives. Some dizziness.  Normally benadryl helps but he has not taken benadryl for this.   Has not ever had formal allergy testing.   PERTINENT  PMH / PSH: Anxiety  OBJECTIVE:   BP (!) 110/58   Pulse 86   Ht 5\' 10"  (1.778 m)   Wt 202 lb 12.8 oz (92 kg)   LMP 09/04/2022 (Exact Date)   SpO2 100%   BMI 29.10 kg/m    General: NAD, pleasant, able to participate in exam HEENT: Normocephalic, oropharynx clear, MMM Neck: Supple but with ttp over bilateral strap muscles, no LAD  Cardiac: RRR, no murmurs. Respiratory: CTAB, normal effort, No wheezes, rales or rhonchi Abdomen: Non-distended Skin: warm and dry, no urticaria noted Psych: Normal affect and mood     09/04/2022    3:19 PM 08/28/2022    3:31 PM 08/14/2022    3:05 PM  Depression screen PHQ 2/9  Decreased Interest 2 2   Down, Depressed, Hopeless 1 2   PHQ - 2 Score 3 4   Altered sleeping 2 3   Tired, decreased energy 1 2   Change in appetite 1 2   Feeling bad or failure about yourself  0 1   Trouble concentrating 0 1   Moving slowly or fidgety/restless 0 0   Suicidal thoughts 0 0   PHQ-9 Score 7 13   Difficult doing work/chores   Very difficult   ASSESSMENT/PLAN:   1. Food allergy No concerns for anaphylaxis. Lung exam clear without evidence of increased WOB, stridor or wheezing. Over 12 hours since exposure. Without evidence of hives. Recommend H-1 antagonist, pt states they have claritin at home. No need for steroids at this time.  Discussed signs of anaphylaxis.  Does not appear that he has ever had a  reaction that severe, however, will send an EpiPen until he is able to be evaluated by allergy for formal testing. -EPINEPHrine 0.3 mg/0.3 mL IJ SOAJ injection; Inject 0.3 mg into the muscle as needed for anaphylaxis.  Dispense: 1 each; Refill: 1 - Ambulatory referral to Allergy - Take claritin daily   2. Anxiety Has f/u next week to discuss further but notes some improvement since last visit. PHQ-9 score shows significant improvement.  -Continue BuSpar and Paxil -Keep follow-up next week  Sabino Dick, DO [facility]

## 2022-09-12 ENCOUNTER — Encounter: Payer: Self-pay | Admitting: Family Medicine

## 2022-09-12 ENCOUNTER — Ambulatory Visit: Payer: Medicaid Other | Admitting: Family Medicine

## 2022-09-12 VITALS — BP 123/78 | HR 76 | Ht 70.0 in | Wt 207.1 lb

## 2022-09-12 DIAGNOSIS — F419 Anxiety disorder, unspecified: Secondary | ICD-10-CM

## 2022-09-12 DIAGNOSIS — F32A Depression, unspecified: Secondary | ICD-10-CM | POA: Diagnosis not present

## 2022-09-12 MED ORDER — FLUOXETINE HCL 40 MG PO CAPS: 40.0000 mg | ORAL_CAPSULE | Freq: Every day | ORAL | 3 refills | Status: AC

## 2022-09-12 MED ORDER — BUSPIRONE HCL 7.5 MG PO TABS: 7.5000 mg | ORAL_TABLET | Freq: Two times a day (BID) | ORAL | 2 refills | Status: DC

## 2022-09-12 NOTE — Patient Instructions (Signed)
It was wonderful to see you today. I am sorry that you are not feeling well.   Today we talked about:  Take 40 mg of Prozac daily. I have changed your Buspar so that you can take it only twice a day.  Continue your efforts to find a therapist. See options below. Follow up with me in about 2 weeks. You can shoot me a message on MyChart to let me know how you are doing or we can do a virtual visit.   Thank you for coming to your visit as scheduled. We have had a large "no-show" problem lately, and this significantly limits our ability to see and care for patients. As a friendly reminder- if you cannot make your appointment please call to cancel. We do have a no show policy for those who do not cancel within 24 hours. Our policy is that if you miss or fail to cancel an appointment within 24 hours, 3 times in a 11-month period, you may be dismissed from our clinic.   Thank you for choosing [facility].   Please call [phone] with any questions about today's appointment.  Please be sure to schedule follow up at the front  desk before you leave today.   Sabino Dick, DO PGY-3 Family Medicine     Therapy and Counseling Resources Most providers on this list will take Medicaid. Patients with commercial insurance or Medicare should contact their insurance company to get a list of in network providers.  [employer] (takes children) Location 1: [address] [place], [place] [address] Location 2: [address] [place], [place] [address] [phone]   [facility] (spanish speaking therapist available)(habla espanol)(take medicare and medicaid)  [address], [place], [place] [address], [place] [email or web address] [phone]  [facility]iatry and Counseling [address] [place], [place] [address] [phone]  [facility]   [address], Suite [place], [place] [address]      [phone]  [facility] (spanish available) [address]  [place], [place] [address] [phone]  [facility] (take [facility] and medicare) [address]  [place], [place] [address]       [phone]     [facility] (virtual only) [phone]  Jovita Kussmaul [facility] [address], [place], [place] [phone]  [facility]:  [address] [place] [phone]  [facility]:  [address] [place] [place] [phone]  [facility] (under & uninsured) [address]   [place] [phone]    [email or web address]    [facility] [address]  [place]    [phone]  [facility] -[address]     Phone:  [phone]     [facility]-  [address]  [phone]   [facility] [address]      [place], [place] [phone]  [facility]: [address] [place], [place], [place]  [phone]   [facility] (Spanish therapist) [email or web address]  [address] [place]  [address]   [place] [address]    [phone]    [facility]   [address],  [place], [place]  [phone]   [facility]  [address] [place]  [phone]  [facility]  [address] [place], [place]        [phone]  Open Access/Walk In Clinic under & uninsured  [facility]  [address] [place], [place] [phone] Crisis [phone]  [facility] [place],  (Spanish)   [address], [place]: [phone]) 8:30 - 12; 1 - 2:30  [facility] [employer],  [address] [place], [place]    ([phone]):8:30 - 12; 2 - 3PM  [facility],  [address],  [place]; [phone]):   Mon - Fri 8 AM - 5 PM  [facility] [address] [place]  MWF 12:30 to 3:00 or call to schedule an appointment  [phone]  Specific Provider options Psychology Today  [email or web address] click on find a therapist  enter your zip code left side and select or tailor a therapist for your specific need.   [facility] Provider Directory [email or web address]  (Medicaid)   Follow all drop down to find a provider  Social Support program [facility] [place] [phone] or PhotoSolver.pl [address], [place], [place] Recovery support and educational   24- Hour Availability:   [facility]  [address] [place], [place] [phone] Crisis [phone]  [facility] the [facility] [phone]  [place] [facility]  [phone]   [facility]  [phone] (after hours)  [facility]/Mobile Crisis   [phone]  [place] National Suicide Hotline  [phone] Len Childs)  Call 911 or go to emergency room  [facility]  [phone]);  [place] and [employer]  [phone]); [place], [place], [place], [place], [place], [place], [place]   If you are feeling suicidal or depression symptoms worsen please immediately go to:   If you are thinking about harming yourself or having thoughts of suicide, or if you know someone who is, seek help right away. If you are in crisis, make sure you are not left alone.  If someone else is in crisis, make sure he/she/they is not left alone  Call 988 OR 1-800-273-TALK  24 Hour Availability for Walk-IN services  [facility]  [address] [place], [place] [phone] Crisis [phone]    Other crisis resources:  [facility] of the [employer] (Domestic Violence, Rape & Victim Assistance [phone]  [facility] [employer] [facility]  Services    (ONLY from 8am-4pm)    [phone]  Therapeutic Alternative Mobile Crisis Unit (24/7)   [phone]  [place] National Suicide Hotline   [phone] Len Childs)

## 2022-09-12 NOTE — Progress Notes (Signed)
    SUBJECTIVE:   CHIEF COMPLAINT / HPI:   Diane Zuniga is a 22 y.o. adult who presents to the [facility] clinic today to discuss the following concerns:   Depression F/U Feels like symptoms are worse since last visit. Had a hard weekend but last week he had a "really good week". Did not have any thoughts of self-harm but did have some self loathing thoughts. States that the things that used to make him happy are no longer bringing him joy. Has been emotionally eating.   Still trying to get in contact with a therapist.  Feels like anxiety is better. Would prefer if Buspar was twice a day instead of three times a day, hard for him to take during work hours.   PERTINENT  PMH / PSH: Anxiety   OBJECTIVE:   BP 123/78   Pulse 76   Ht  (1.778 m)   Wt 207 lb 2 oz (94 kg)   LMP 08/31/2022 (Exact Date)   SpO2 100%   BMI 29.72 kg/m    General: NAD, able to participate in exam Respiratory: normal effort Psych: Flat affect and depressed mood     09/12/2022    3:39 PM 09/04/2022    3:19 PM 08/28/2022    3:31 PM  Depression screen PHQ 2/9  Decreased Interest Down, Depressed, Hopeless PHQ - 2 Score Altered sleeping Tired, decreased energy Change in appetite Feeling bad or failure about yourself  2 0 1  Trouble concentrating 0 0 1  Moving slowly or fidgety/restless 0 0 0  Suicidal thoughts 0 0 0  PHQ-9 Score ASSESSMENT/PLAN:   1. Anxiety and depression PHQ-9 slightly worse when compared to last visit on 4/15.  Patient feels subjectively worse for the last several days.  Feels that his anxiety is well-controlled with the BuSpar but still having significant depression with lack of energy and pleasure in normal activities that used to bring him joy. Still working to find a therapist which I think would be very helpful. Reassuringly, no SI or thoughts of self-harm. Given 4-wks on Prozac 20 mg without much improvement, reasonable to  increase dose.  - busPIRone (BUSPAR) 7.5 MG tablet; Take 1 tablet (7.5 mg total) by mouth 2 (two) times daily.  Dispense: 60 tablet; Refill: 2 - FLUoxetine (PROZAC) 40 MG capsule; Take 1 capsule (40 mg total) by mouth daily.  Dispense: 90 capsule; Refill: 3 - List of Therapists provided again today - Suicide hotline resources provided - F/u in 2 weeks; can be virtual to help with accessibility and him not missing so much work    Sabino Dick, DO [facility]

## 2022-10-01 IMAGING — DX DG HAND COMPLETE 3+V*R*
3 series · 3 of 3 positions shown · non-contrast
Comparison: None.

CLINICAL DATA: Hand weakness.

EXAM:
RIGHT HAND - COMPLETE 3+ VIEW

[dg hand complete right (1 of 3)]
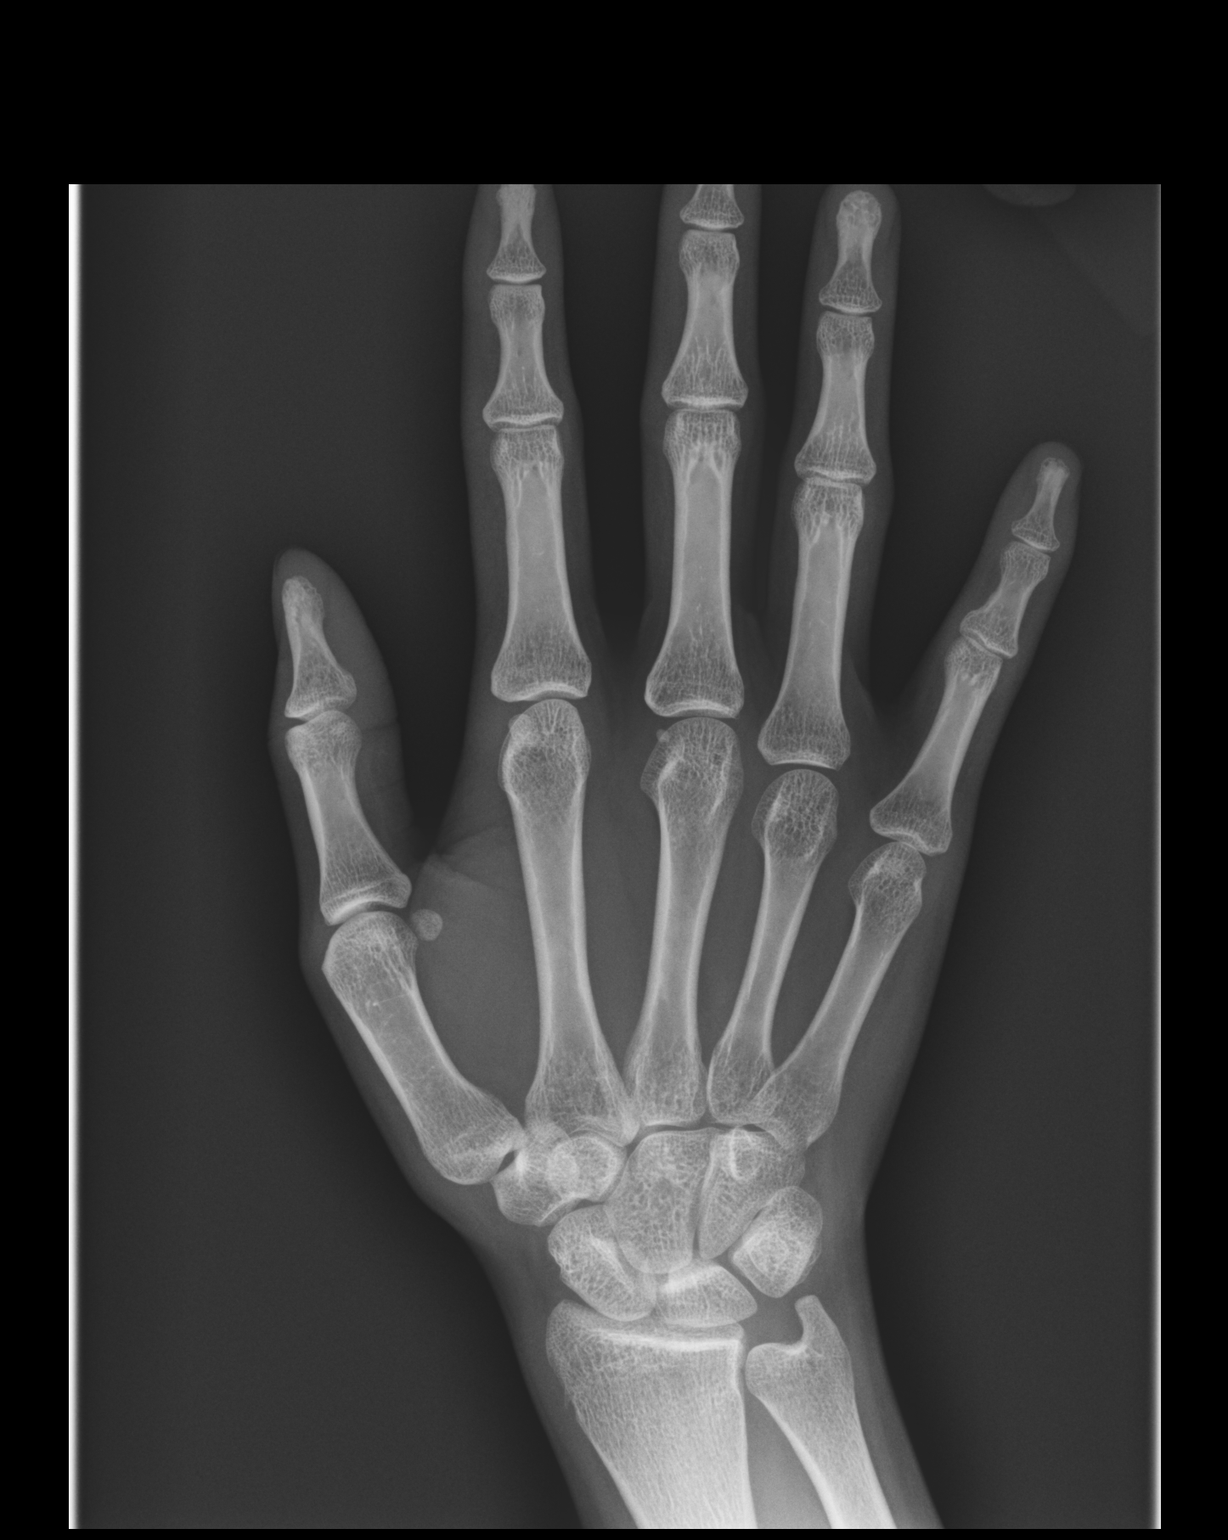

[dg hand complete right (2 of 3)]
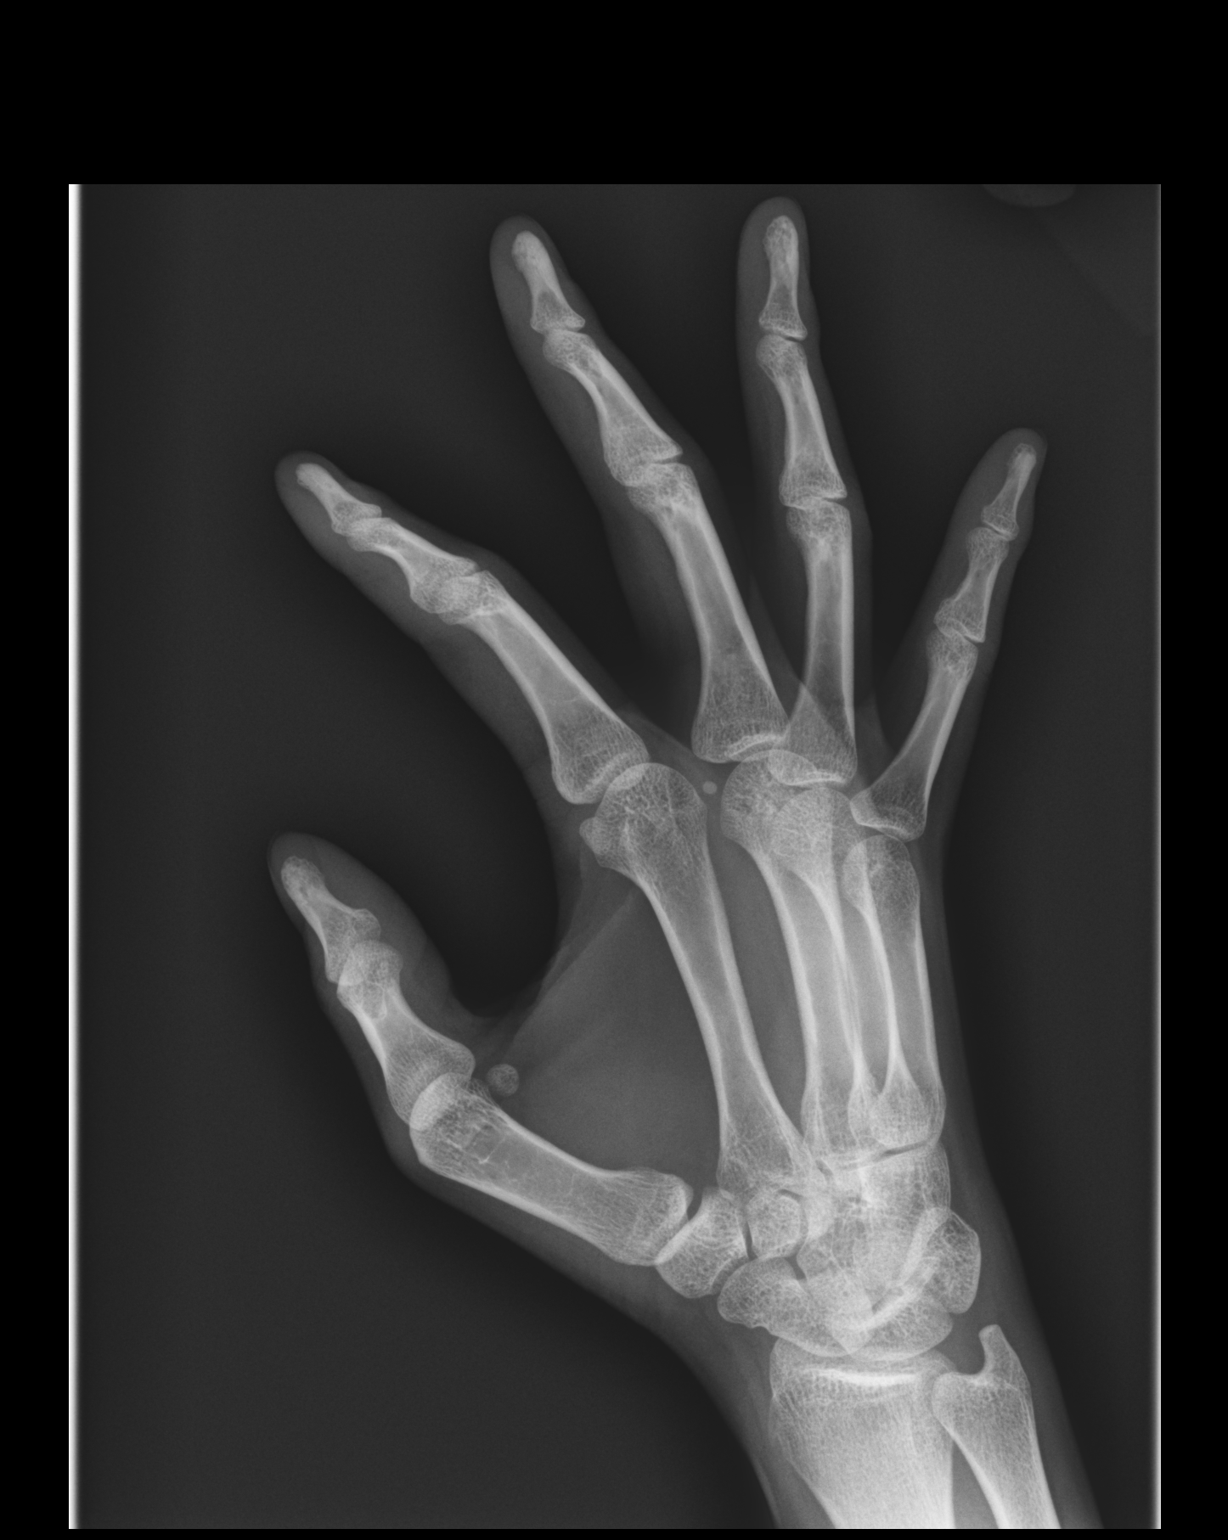

[dg hand complete right (3 of 3)]
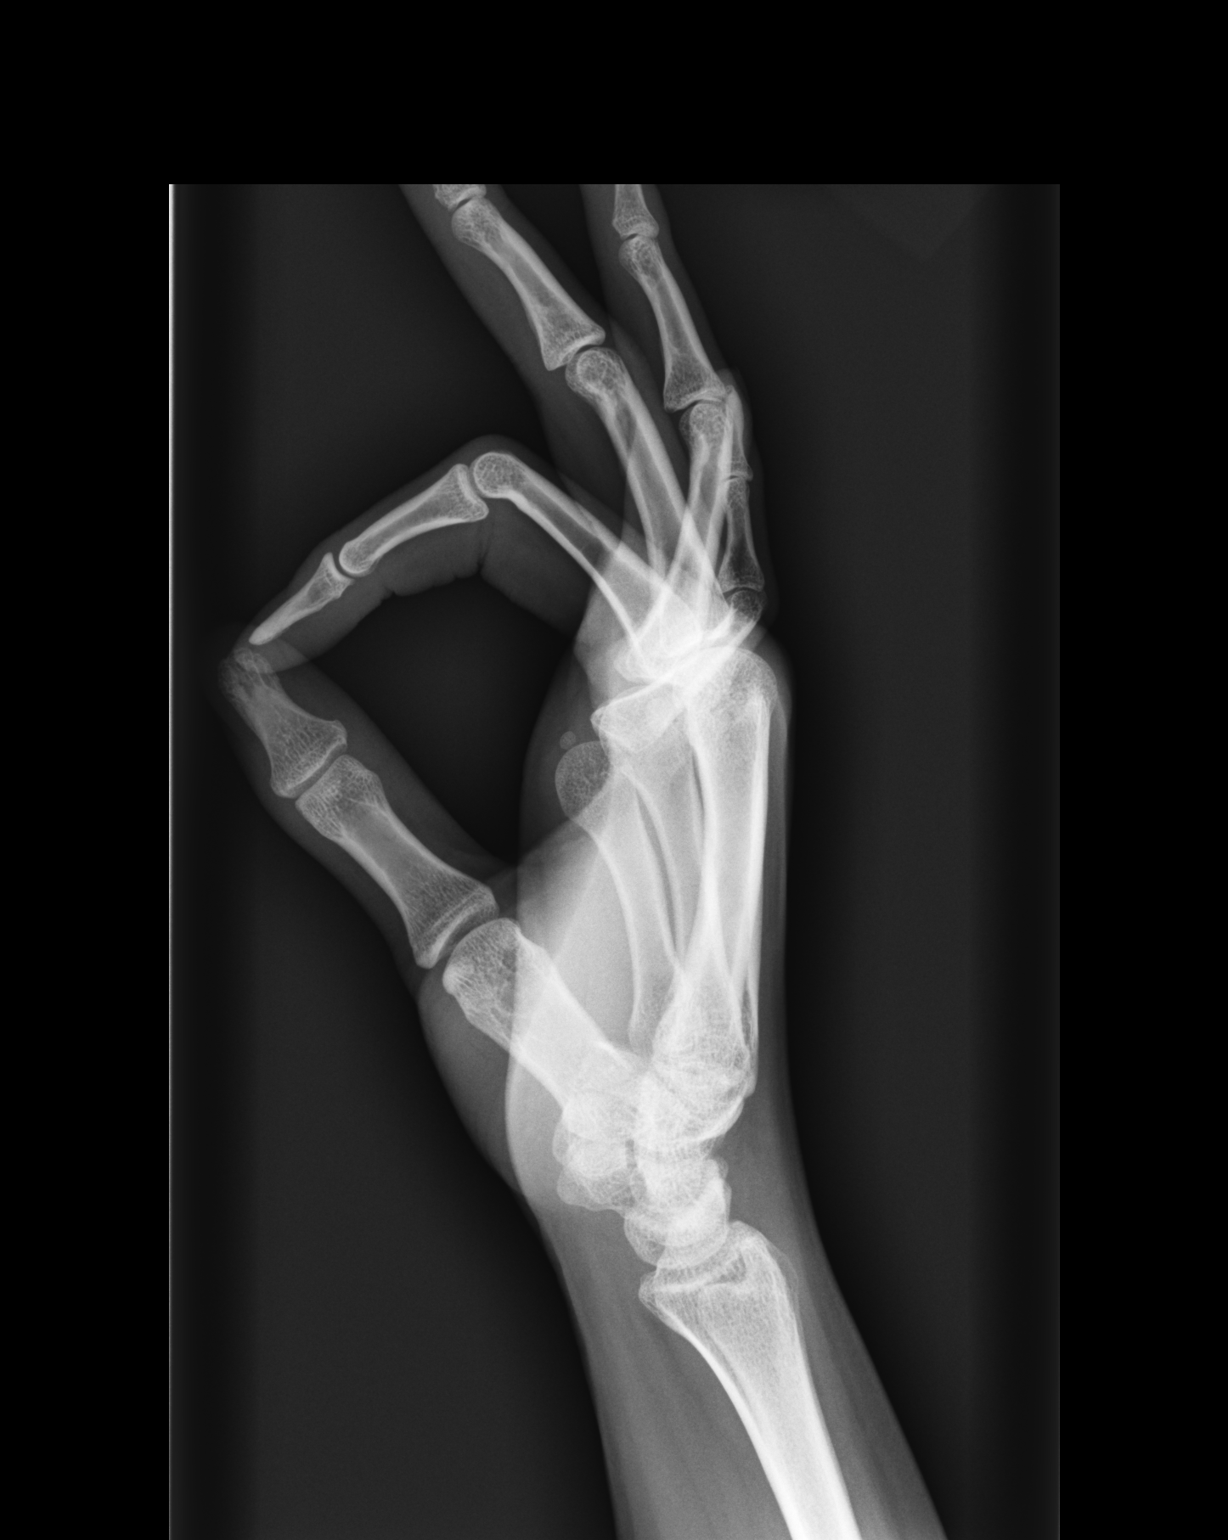

[3 of 3 positions shown; findings below may reference images not displayed]

FINDINGS: There is no evidence of fracture or dislocation. There is no
evidence of arthropathy or other focal bone abnormality. Soft
tissues are unremarkable.
IMPRESSION: Negative.

## 2022-12-15 ENCOUNTER — Other Ambulatory Visit: Payer: Self-pay

## 2022-12-15 DIAGNOSIS — F419 Anxiety disorder, unspecified: Secondary | ICD-10-CM

## 2022-12-15 MED ORDER — BUSPIRONE HCL 7.5 MG PO TABS: 7.5000 mg | ORAL_TABLET | Freq: Two times a day (BID) | ORAL | 2 refills | Status: DC

## 2023-04-09 ENCOUNTER — Telehealth: Payer: Self-pay

## 2023-04-09 NOTE — Telephone Encounter (Signed)
Patient calls nurse line requesting a referral for plastic surgery.   He reports he was referred a few years ago for "top surgery" however due to financial reasons he could not make the apt.   He reports he found a place that accepts his insurance. He asks we send the new referral to [facility].  Will forward to PCP.   He reports their fax number for the referral coordinator is: [phone].

## 2023-04-10 ENCOUNTER — Ambulatory Visit (INDEPENDENT_AMBULATORY_CARE_PROVIDER_SITE_OTHER): Payer: 59 | Admitting: Family Medicine

## 2023-04-10 ENCOUNTER — Encounter: Payer: Self-pay | Admitting: Family Medicine

## 2023-04-10 VITALS — BP 116/76 | HR 81 | Wt 225.0 lb

## 2023-04-10 DIAGNOSIS — R5383 Other fatigue: Secondary | ICD-10-CM | POA: Diagnosis not present

## 2023-04-10 NOTE — Assessment & Plan Note (Addendum)
History and exam most concerning for recurrent iron deficiency anemia as cause of fatigue and headaches.  Reassured by lack of focal neurological findings on exam. Orthostatic vitals negative. Will obtain CBC with iron studies. Will also measure TSH, vitamin D, vitamin B12, BMP to rule out other causes of fatigue.  If low iron, will set up IV iron transfusion given inability to tolerate oral iron.  Can also discuss various contraceptive options to help with heavy menstrual bleeding in the future as desired.

## 2023-04-10 NOTE — Patient Instructions (Signed)
We will get your labs today. I will follow these up with you. If your symptoms get worse before I update you on labs, please let me know, or go to the ED. Your symptoms may be due to iron deficiency anemia.

## 2023-04-10 NOTE — Progress Notes (Signed)
    SUBJECTIVE:   CHIEF COMPLAINT / HPI:   Fatigue, headaches Head has been hurting around eyes for the last two or three days. He had to call out of work. He has been swaying while standing. Had midol which improved cramps but not the headache. Feels like it has been hard to focus recently. Denies blurry vision. Has felt like he is seeing black spots and the lights in the room are bothering him. Feels SOB and that causes dizziness.  All of this mostly happens when she stands up. Describes headache as dull. Goes down their jaw to their neck. Endorses fatigue that makes it hard to get out bed. Has gotten 12 hours of sleep. Denies N/V. Denies heart palpitations.  The symptoms feel very similar to whenever he was anemic and required an IV iron infusion in the past for heavy menstrual periods.  Menstrual period is on currently. Usually 7-9 days and pretty heavy. Uses a diaper every 4 hours given amounts.  PERTINENT  PMH / PSH: IDA, anxiety  OBJECTIVE:   BP 116/76   Pulse 81   Wt 225 lb (102.1 kg)   LMP 04/03/2023   SpO2 98%   BMI 32.28 kg/m   General: Alert and oriented, in NAD Skin: Warm, dry, and intact without lesions HEENT: NCAT, EOM grossly normal, midline nasal septum Cardiac: RRR, no m/r/g appreciated Respiratory: CTAB, breathing and speaking comfortably on RA Extremities: Moves all extremities grossly equally Neurological: Cranial nerves intact, strength and sensation intact throughout, gait normal Psychiatric: Appropriate mood and affect, normal thought process  ASSESSMENT/PLAN:   Fatigue History and exam most concerning for recurrent iron deficiency anemia as cause of fatigue and headaches.  Reassured by lack of focal neurological findings on exam. Orthostatic vitals negative. Will obtain CBC with iron studies. Will also measure TSH, vitamin D, vitamin B12, BMP to rule out other causes of fatigue.  If low iron, will set up IV iron transfusion given inability to tolerate oral  iron.  Can also discuss various contraceptive options to help with heavy menstrual bleeding in the future as desired.   Oscar La, Medical Student [facility]   I was personally present and performed or re-performed the history, physical exam and medical decision making activities of this service and have verified that the service and findings are accurately documented in the student's note.  Janeal Holmes, MD                  04/10/2023, 5:52 PM

## 2023-04-11 ENCOUNTER — Other Ambulatory Visit: Payer: Self-pay | Admitting: Family Medicine

## 2023-04-11 ENCOUNTER — Encounter: Payer: Self-pay | Admitting: Family Medicine

## 2023-04-11 ENCOUNTER — Telehealth: Payer: Self-pay | Admitting: Family Medicine

## 2023-04-11 DIAGNOSIS — D500 Iron deficiency anemia secondary to blood loss (chronic): Secondary | ICD-10-CM

## 2023-04-11 LAB — BASIC METABOLIC PANEL WITH GFR
BUN/Creatinine Ratio: 12 (ref 9–23)
BUN: 8 mg/dL (ref 6–20)
CO2: 22 mmol/L (ref 20–29)
Calcium: 9.7 mg/dL (ref 8.7–10.2)
Chloride: 104 mmol/L (ref 96–106)
Creatinine, Ser: 0.68 mg/dL (ref 0.57–1.00)
Glucose: 86 mg/dL (ref 70–99)
Potassium: 4.7 mmol/L (ref 3.5–5.2)
Sodium: 139 mmol/L (ref 134–144)
eGFR: 125 mL/min/1.73

## 2023-04-11 LAB — CBC WITH DIFFERENTIAL/PLATELET
Basophils Absolute: 0.1 10*3/uL (ref 0.0–0.2)
Basos: 1 %
EOS (ABSOLUTE): 0.1 10*3/uL (ref 0.0–0.4)
Eos: 1 %
Hematocrit: 28 % — ABNORMAL LOW (ref 34.0–46.6)
Hemoglobin: 7.8 g/dL — ABNORMAL LOW (ref 11.1–15.9)
Immature Grans (Abs): 0 10*3/uL (ref 0.0–0.1)
Immature Granulocytes: 0 %
Lymphocytes Absolute: 1.9 10*3/uL (ref 0.7–3.1)
Lymphs: 39 %
MCH: 18.9 pg — ABNORMAL LOW (ref 26.6–33.0)
MCHC: 27.9 g/dL — ABNORMAL LOW (ref 31.5–35.7)
MCV: 68 fL — ABNORMAL LOW (ref 79–97)
Monocytes Absolute: 0.5 10*3/uL (ref 0.1–0.9)
Monocytes: 9 %
Neutrophils Absolute: 2.5 10*3/uL (ref 1.4–7.0)
Neutrophils: 50 %
Platelets: 734 10*3/uL — ABNORMAL HIGH (ref 150–450)
RBC: 4.13 x10E6/uL (ref 3.77–5.28)
RDW: 19.4 % — ABNORMAL HIGH (ref 11.7–15.4)
WBC: 5 10*3/uL (ref 3.4–10.8)

## 2023-04-11 LAB — TSH RFX ON ABNORMAL TO FREE T4: TSH: 1.63 u[IU]/mL (ref 0.450–4.500)

## 2023-04-11 LAB — VITAMIN D 25 HYDROXY (VIT D DEFICIENCY, FRACTURES): Vit D, 25-Hydroxy: 12 ng/mL — ABNORMAL LOW (ref 30.0–100.0)

## 2023-04-11 LAB — VITAMIN B12: Vitamin B-12: 607 pg/mL (ref 232–1245)

## 2023-04-11 MED ORDER — CHOLECALCIFEROL 1.25 MG (50000 UT) PO TABS: 1.0000 | ORAL_TABLET | ORAL | 0 refills | Status: AC

## 2023-04-11 NOTE — Telephone Encounter (Signed)
Called to discuss lab results with patient. Vitamin B12 and TSH normal. Vitamin D low at 12; will send in weekly vitamin D 50,000 U for 8 weeks. Can go to daily supplementation at that time. Recheck around end of February 2025.  While iron panel is still pending (it was an add-on lab), CBC demonstrates anemia to 7.8 with low MCV, likely indicating IDA. Patient is no longer having menstrual bleeding, but symptoms are still present. Will order IV iron infusion and route to team for scheduling per protocol.  Advised I would like for him to be seen later this week to assess symptoms and possibly recheck POCT Hgb while awaiting IV iron transfusion (did not tolerate oral iron in past due to GI side effects). Appointment made on Friday, 11/22 at 9:10 AM with Dr. Barb Merino. Discussed seeking care sooner should bleeding/symptoms worsen before then.

## 2023-04-12 NOTE — Telephone Encounter (Signed)
Patient calls nurse line in regards to letter.   Patient reports he feels he will need multiple days off of work throughout the next several months due to condition for rest and doctors apts.  Discussed with patient to contact his HR department and request FMLA forms.  Patient agreed with plan.

## 2023-04-12 NOTE — Progress Notes (Signed)
    SUBJECTIVE:   CHIEF COMPLAINT / HPI:   Anemia - recently seen for fatigue and found to have hgb 7.8, planning for IV iron infusion but encouraged to f/u today for POC hgb  Has not started weekly vitamin D - hasn't heard back from pharmacy yet  Still having persistent fatigue symptoms Denies recent bleeding  PERTINENT  PMH / PSH: IDA  OBJECTIVE:   BP 122/73   Pulse 79   Ht 5\' 10"  (1.778 m)   Wt 230 lb 9.6 oz (104.6 kg)   LMP 04/03/2023   SpO2 99%   BMI 33.09 kg/m   General: NAD, pleasant, able to participate in exam Cardiac: RRR, no murmurs auscultated Respiratory: CTAB, normal WOB Abdomen: soft, non-tender, non-distended, normoactive bowel sounds Extremities: warm and well perfused, no edema or cyanosis Skin: warm and dry, no rashes noted Neuro: alert, no obvious focal deficits, speech normal Psych: Normal affect and mood  ASSESSMENT/PLAN:   Assessment & Plan Anemia, unspecified type Point-of-care hemoglobin stable today 7.6 from 7.8 3 days ago.  Continues to have symptoms of fatigue but denies significant shortness of breath, syncope.  Provided return precautions but do not feel that transfusion is necessary at this time given stable symptoms and hemoglobin.  IV iron infusion was ordered by Dr. Phineas Real, patient still has not heard back about scheduling this.  Upon further discussion with infusion clinic, he should receive a call from his pharmacy to set this up.   Vonna Drafts, MD [facility]

## 2023-04-13 ENCOUNTER — Ambulatory Visit (INDEPENDENT_AMBULATORY_CARE_PROVIDER_SITE_OTHER): Payer: 59 | Admitting: Family Medicine

## 2023-04-13 ENCOUNTER — Telehealth: Payer: Self-pay

## 2023-04-13 ENCOUNTER — Telehealth: Payer: Self-pay | Admitting: Family Medicine

## 2023-04-13 ENCOUNTER — Other Ambulatory Visit: Payer: Self-pay | Admitting: Family Medicine

## 2023-04-13 ENCOUNTER — Encounter: Payer: Self-pay | Admitting: Family Medicine

## 2023-04-13 VITALS — BP 122/73 | HR 79 | Ht 70.0 in | Wt 230.6 lb

## 2023-04-13 DIAGNOSIS — D649 Anemia, unspecified: Secondary | ICD-10-CM

## 2023-04-13 DIAGNOSIS — Z789 Other specified health status: Secondary | ICD-10-CM

## 2023-04-13 LAB — SPECIMEN STATUS REPORT

## 2023-04-13 LAB — POCT HEMOGLOBIN: Hemoglobin: 7.6 g/dL — AB (ref 11–14.6)

## 2023-04-13 LAB — IRON,TIBC AND FERRITIN PANEL
Ferritin: 4 ng/mL — ABNORMAL LOW (ref 15–150)
Iron Saturation: 2 % — CL (ref 15–55)
Iron: 10 ug/dL — ABNORMAL LOW (ref 27–159)
Total Iron Binding Capacity: 460 ug/dL — ABNORMAL HIGH (ref 250–450)
UIBC: 450 ug/dL — ABNORMAL HIGH (ref 131–425)

## 2023-04-13 NOTE — Patient Instructions (Addendum)
Please go to your IV iron infusion appointment  Follow up in our clinic 5-7 days after your infusion  In the meantime please visit the ED if you have trouble breathing, chest pain, bleeding, or passing out

## 2023-04-13 NOTE — Telephone Encounter (Signed)
Patient is requesting a new referral to [facility] in [place] for top surgery, stating the previous doctor did not work out.    [facility]: Fax # [phone]

## 2023-04-13 NOTE — Telephone Encounter (Signed)
Tried to contact the patient to inform them that their infusion appt was cancelled however he did not answer and I could not leave a voicemail for him.

## 2023-04-16 ENCOUNTER — Other Ambulatory Visit: Payer: Self-pay | Admitting: Family Medicine

## 2023-04-16 ENCOUNTER — Inpatient Hospital Stay (HOSPITAL_COMMUNITY): Admission: RE | Admit: 2023-04-16 | Payer: 59 | Source: Ambulatory Visit

## 2023-04-16 MED ORDER — FERUMOXYTOL INJECTION 510 MG/17 ML: 510.0000 mg | Freq: Once | INTRAVENOUS | Status: DC

## 2023-04-16 NOTE — Telephone Encounter (Signed)
Patient calls nurse line in regards to Iron Infusion.   Advised will call [facility] location to get him scheduled as soon as possible.   He expressed frustration as he needs to "get back to work and make money."  I called the [facility] location, however I had to LVM.   Scheduling number for [facility] Location is [phone].   Will await their return call.

## 2023-04-16 NOTE — Telephone Encounter (Signed)
Received returned call from [facility] location. They are unable to schedule without specific orders that then have to be verified by pharmacy.   Orders need to indicate the type of iron, number of infusions and amount.   If patient needs repeat next week, [address] location does not have any availability until the second week in December.   Called back to [facility] Location. They could possibly schedule patient for next week once orders are placed.   Once orders are placed, please route back to team for scheduling. Scheduling number for [facility] Location is [phone].  Veronda Prude, RN

## 2023-04-17 MED ORDER — FERROUS SULFATE 300 (60 FE) MG/5ML PO SOLN: 300.0000 mg | ORAL | 3 refills | Status: DC

## 2023-04-17 NOTE — Telephone Encounter (Signed)
Iron Infusion has been scheduled for 12/5 at 8am at [facility].   This is soonest available apt.   He is requesting an extended work note. He reports continued fatigue and his inability to work until infusion.   He reports he can not take the iron pills, however is wondering if there is a liquid form.   Advised will forward back to provider.

## 2023-04-17 NOTE — Addendum Note (Signed)
Addended by: Evette Georges B on: 04/17/2023 01:30 PM   Modules accepted: Orders

## 2023-04-24 ENCOUNTER — Other Ambulatory Visit: Payer: Self-pay | Admitting: Family Medicine

## 2023-04-24 DIAGNOSIS — F32A Depression, unspecified: Secondary | ICD-10-CM

## 2023-04-26 ENCOUNTER — Telehealth: Payer: Self-pay | Admitting: Pharmacy Technician

## 2023-04-26 ENCOUNTER — Encounter (HOSPITAL_COMMUNITY): Payer: Self-pay | Admitting: Family Medicine

## 2023-04-26 ENCOUNTER — Telehealth: Payer: Self-pay

## 2023-04-26 ENCOUNTER — Telehealth: Payer: Self-pay | Admitting: Family Medicine

## 2023-04-26 ENCOUNTER — Ambulatory Visit (HOSPITAL_COMMUNITY)
Admission: RE | Admit: 2023-04-26 | Discharge: 2023-04-26 | Disposition: A | Discharge status: Home / Self Care | Payer: 59 | Source: Ambulatory Visit | Attending: Family Medicine | Admitting: Family Medicine

## 2023-04-26 ENCOUNTER — Ambulatory Visit (HOSPITAL_COMMUNITY)
Admission: EM | Admit: 2023-04-26 | Discharge: 2023-04-26 | Disposition: A | Discharge status: Home / Self Care | Payer: 59 | Attending: Family Medicine | Admitting: Family Medicine

## 2023-04-26 DIAGNOSIS — M25562 Pain in left knee: Secondary | ICD-10-CM | POA: Diagnosis not present

## 2023-04-26 DIAGNOSIS — M25559 Pain in unspecified hip: Secondary | ICD-10-CM | POA: Diagnosis not present

## 2023-04-26 DIAGNOSIS — M7122 Synovial cyst of popliteal space [Baker], left knee: Secondary | ICD-10-CM

## 2023-04-26 DIAGNOSIS — D500 Iron deficiency anemia secondary to blood loss (chronic): Secondary | ICD-10-CM | POA: Diagnosis present

## 2023-04-26 HISTORY — DX: Iron deficiency anemia, unspecified: D50.9

## 2023-04-26 MED ORDER — SODIUM CHLORIDE 0.9 % IV SOLN
510.0000 mg | Freq: Once | INTRAVENOUS | Status: AC
  Administered 2023-04-26: 510 mg via INTRAVENOUS
  Filled 2023-04-26: qty 510

## 2023-04-26 MED ORDER — MELOXICAM 15 MG PO TABS: 15.0000 mg | ORAL_TABLET | Freq: Every day | ORAL | 0 refills | Status: AC

## 2023-04-26 MED ORDER — FERROUS SULFATE 325 (65 FE) MG PO TABS: 325.0000 mg | ORAL_TABLET | ORAL | 3 refills | Status: DC

## 2023-04-26 NOTE — Telephone Encounter (Signed)
Return-to-work release form completed. Released without restriction though added in comments patient to further determine his functional status after iron infusion. Form placed in RN triage box. Please scan into chart and notify patient the form is ready for pickup.

## 2023-04-26 NOTE — Addendum Note (Signed)
Addended by: Evette Georges B on: 04/26/2023 01:33 PM   Modules accepted: Orders

## 2023-04-26 NOTE — ED Provider Notes (Signed)
[facility]    CSN: [id number] Arrival date & time: 04/26/23  7829      History   Chief Complaint Chief Complaint  Patient presents with   Leg Pain    HPI Diane Zuniga is a 22 y.o. adult.   The patient is a 22 year old female who notes some posterior left knee pain after standing up today and some discomfort when walking.  She is also having some chronic pain over the left hip with walking.  She has not had any falls or trauma to the area.  There has been no numbness or weakness of the lower extremity.  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  She denies any fever, chills, bruising, skin changes, or unexplained weight loss.   The history is provided by the patient.  Leg Pain Associated symptoms: no back pain, no fatigue and no fever     Past Medical History:  Diagnosis Date   Anxiety    Back pain 03/31/2020   COVID-19 vaccine administered 05/26/2020   Family history of cardiomyopathy    Family history of prostate cancer    Family history of uterine cancer    Iron deficiency anemia    Left knee pain 03/31/2020   Palpitations     Patient Active Problem List   Diagnosis Date Noted   Fatigue 04/10/2023   Acute cystitis without hematuria 06/26/2022   Need for Tdap vaccination 06/26/2022   Painful menstrual periods 10/06/2021   Healthcare maintenance 10/06/2021   Abnormality of gait 02/03/2021   Genetic testing 12/05/2020   Family history of prostate cancer 11/19/2020   Family history of uterine cancer 10/28/2020   Iron deficiency anemia 09/29/2020   Weakness of both hands 09/16/2020   Family history of cardiomyopathy 04/30/2020   Anxiety 04/30/2020    Past Surgical History:  Procedure Laterality Date   WISDOM TOOTH EXTRACTION      OB History   No obstetric history on file.      Home Medications    Prior to Admission medications   Medication Sig Start Date End Date Taking? Authorizing Provider   busPIRone (BUSPAR) 7.5 MG tablet Take 1 tablet (7.5 mg total) by mouth 2 (two) times daily. 12/15/22  Yes Lockie Mola, MD  Cholecalciferol 1.25 MG (50000 UT) TABS Take 1 capsule by mouth once a week for 8 doses. 04/11/23 05/31/23 Yes Mabe, Earvin Hansen, MD  FLUoxetine (PROZAC) 40 MG capsule Take 1 capsule (40 mg total) by mouth daily. 09/12/22  Yes Sabino Dick, DO  meloxicam (MOBIC) 15 MG tablet Take 1 tablet (15 mg total) by mouth daily. 04/26/23 05/26/23 Yes Ivor Messier, MD  buPROPion (WELLBUTRIN XL) 300 MG 24 hr tablet Take 1 tablet (300 mg total) by mouth daily. Patient not taking: Reported on 06/23/2022 10/12/21   Sabino Dick, DO  EPINEPHrine 0.3 mg/0.3 mL IJ SOAJ injection Inject 0.3 mg into the muscle as needed for anaphylaxis. Patient not taking: Reported on 09/12/2022 09/04/22   Sabino Dick, DO  ferrous sulfate 300 (60 Fe) MG/5ML syrup Take 5 mLs (300 mg total) by mouth every other day. 04/17/23   Evette Georges, MD    Family History Family History  Problem Relation Age of Onset   Cardiomyopathy Mother 61   Cardiomyopathy Brother 1   Autism Brother    Fibroids Paternal Aunt    Uterine cancer Paternal Aunt        dx late 36s   Fibroids  Paternal Aunt    Fibroids Paternal Aunt    Fibroids Paternal Aunt    Uterine cancer Paternal Grandmother        dx 60s, hysterectomy   Prostate cancer Paternal Grandfather 79       metastatic   Cancer Other        hysterectomy due to cancer (dx 73s) in 3 paternal great-aunts   Diabetes Half-Brother    Uterine cancer Paternal Great-grandmother        hysterectomy    Social History Social History   Tobacco Use   Smoking status: Never    Passive exposure: Never   Smokeless tobacco: Never  Vaping Use   Vaping status: Never Used  Substance Use Topics   Alcohol use: Not Currently   Drug use: Never     Allergies   Citrus   Review of Systems Review of Systems  Constitutional:  Negative for activity change,  chills, fatigue and fever.  Musculoskeletal:  Positive for arthralgias and gait problem. Negative for back pain, joint swelling and myalgias.  Skin:  Negative for color change and wound.  Neurological:  Negative for weakness and numbness.     Physical Exam Triage Vital Signs ED Triage Vitals  Encounter Vitals Group     BP 04/26/23 1153 122/80     Systolic BP Percentile --      Diastolic BP Percentile --      Pulse Rate 04/26/23 1153 97     Resp 04/26/23 1153 20     Temp 04/26/23 1153 98 F (36.7 C)     Temp Source 04/26/23 1153 Oral     SpO2 04/26/23 1153 97 %     Weight 04/26/23 1153 230 lb (104.3 kg)     Height 04/26/23 1153 5\' 10"  (1.778 m)     Head Circumference --      Peak Flow --      Pain Score 04/26/23 1150 7     Pain Loc --      Pain Education --      Exclude from Growth Chart --    No data found.  Updated Vital Signs BP 122/80 (BP Location: Right Arm)   Pulse 97   Temp 98 F (36.7 C) (Oral)   Resp 20   Ht 5\' 10"  (1.778 m)   Wt 104.3 kg   LMP 04/03/2023 (Approximate)   SpO2 97%   BMI 33.00 kg/m   Visual Acuity Right Eye Distance:   Left Eye Distance:   Bilateral Distance:    Right Eye Near:   Left Eye Near:    Bilateral Near:     Physical Exam Vitals reviewed.  Constitutional:      General: He is not in acute distress.    Appearance: Normal appearance. He is obese. He is not ill-appearing, toxic-appearing or diaphoretic.  Musculoskeletal:     Comments: 1 leg stance unstable bilaterally but does not elicit pain Trendelenburg positive bilaterally Inspection of the hips shows an increased Q angle There is tenderness to palpation of the posterior aspect of the left greater trochanter No edema, overlying erythema, or warmth. Range of motion full in all directions at the hip, peak flexion does elicit some posterior lateral buttock/hip pain Internal/external rotation full FABER test negative for posterior pain but does elicit some lateral thigh  pain FADIR test negative Inspection of the knee shows no soft tissue edema, erythema, and no warmth to palpation No effusion present Full range of motion at the knee with  some hyperextension and equal bilaterally Varus and valgus stress with some laxity but equal bilaterally Lachman's negative Thessaly's negative Mild tenderness to palpation over the lateral joint line Small outpouching of the posterior aspect of the knee joint with mild tenderness to palpation but no warmth No sensory deficits distally  Skin:    General: Skin is warm and dry.     Capillary Refill: Capillary refill takes 2 to 3 seconds.     Coloration: Skin is not jaundiced.  Neurological:     General: No focal deficit present.     Mental Status: He is alert.     Sensory: No sensory deficit.     Gait: Gait normal.     Deep Tendon Reflexes: Reflexes normal.      UC Treatments / Results  Labs (all labs ordered are listed, but only abnormal results are displayed) Labs Reviewed - No data to display  EKG   Radiology No results found.  Procedures Procedures (including critical care time)  Medications Ordered in UC Medications - No data to display  Initial Impression / Assessment and Plan / UC Course  I have reviewed the triage vital signs and the nursing notes.  Pertinent labs & imaging results that were available during my care of the patient were reviewed by me and considered in my medical decision making (see chart for details).     Left knee pain with baker's cyts -The patient has a hypermobility syndrome that is likely adding to stress on the posterior capsule. - There is possible involvement of the meniscus  -No indication for imaging today. - We discussed use of compression sleeve, ice, and she will start meloxicam daily for the next 2-3 weeks. - Quad training exercises given today.  I emphasized the need to strengthen the muscles for stability given her underlying hypermobility. - I  recommended follow-up with her PCP which she agreed with this  Greater trochanteric pain syndrome - The patient's pain is not bad enough to warrant a steroid injection today. - There is marked weakness of the hip abductors on exam. - We discussed the need to strengthen knees with specific daily exercises.  Teacher, English as a foreign language given. - This is likely adding to her knee instability and pain. - I recommended follow-up with her PCP or sports medicine clinic in the next 4-6 weeks. - The patient voiced understanding and agreement with plan.   Final Clinical Impressions(s) / UC Diagnoses   Final diagnoses:  Greater trochanteric pain syndrome  Acute pain of left knee  Baker cyst, left     Discharge Instructions      You have greater trochanteric pain syndrome which is caused by weakness of the pelvic stabilizer muscles. It is important to do your daily exercises as discussed. I would expect this to improve in the next 4 to 6 weeks with consistent therapy. If you are not having any improvement or worsening of pain follow-up with your PCP or sports medicine clinic You can wear a compression sleeve over the left knee and ice it nightly for pain relief. The exercises for quad strengthening are very important. Start taking meloxicam once daily in the morning with food for the next 2-3 weeks and then as needed after that.     ED Prescriptions     Medication Sig Dispense Auth. Provider   meloxicam (MOBIC) 15 MG tablet Take 1 tablet (15 mg total) by mouth daily. 30 tablet Ivor Messier, MD      PDMP not reviewed this  encounter.   Ivor Messier, MD 04/26/23 1323

## 2023-04-26 NOTE — Discharge Instructions (Addendum)
You have greater trochanteric pain syndrome which is caused by weakness of the pelvic stabilizer muscles. It is important to do your daily exercises as discussed. I would expect this to improve in the next 4 to 6 weeks with consistent therapy. If you are not having any improvement or worsening of pain follow-up with your PCP or sports medicine clinic You can wear a compression sleeve over the left knee and ice it nightly for pain relief. The exercises for quad strengthening are very important. Start taking meloxicam once daily in the morning with food for the next 2-3 weeks and then as needed after that.

## 2023-04-26 NOTE — Telephone Encounter (Signed)
Mychart message sent to patient regarding form completion.   Copy made and placed in batch scanning.   Veronda Prude, RN

## 2023-04-26 NOTE — Telephone Encounter (Signed)
Auth Submission: NO AUTH NEEDED Site of care: Site of care: [facility] Payer: [employer] Medication & CPT/J Code(s) submitted: Venofer (Iron Sucrose) J1756 Route of submission (phone, fax, portal):  Phone # Fax # Auth type: Buy/Bill PB Units/visits requested: [id number] Reference number:  Approval from: 04/26/23  to  06/22/23

## 2023-04-26 NOTE — ED Triage Notes (Signed)
left leg pain, allergy reaction. Patient just got an iron infusion and now having shooting pain down the left leg. Now the leg is stiff and Patient can not bend it. The infusion started today at 0800, this is the Patient's second iron infusion.   Patient has had joint pain in the left leg before but states never like this.

## 2023-04-26 NOTE — Telephone Encounter (Signed)
Patient calls nurse line regarding sudden onset leg pain and difficulty walking post iron transfusion.   He is requesting same day appointment for evaluation. Unfortunately, our office does not have any availability for today.   Denies current shortness of breath or chest pain. Patient speaking in complete sentences.   Advised patient that they could proceed to urgent care for further evaluation of concern.   Veronda Prude, RN

## 2023-04-30 ENCOUNTER — Telehealth: Payer: Self-pay | Admitting: Family Medicine

## 2023-04-30 NOTE — Telephone Encounter (Signed)
[employer] forms completed and placed in RN triage box for pickup. I have messaged patient that they are ready for pickup.

## 2023-04-30 NOTE — Telephone Encounter (Signed)
Patient walked in to pick up the Return to Work op

## 2023-05-01 NOTE — Telephone Encounter (Signed)
Form was given to me by Mabe yesterday afternoon.   Form was placed up front for pick up.   I attempted to call patient multiple times to make aware, however no answer or option for VM.   Form is up front and ready for pick up.

## 2023-05-03 MED ORDER — FERROUS SULFATE 220 (44 FE) MG/5ML PO SOLN: 220.0000 mg | Freq: Every day | ORAL | 3 refills | Status: DC

## 2023-05-03 NOTE — Addendum Note (Signed)
Addended by: Evette Georges B on: 05/03/2023 10:15 AM   Modules accepted: Orders

## 2023-05-04 ENCOUNTER — Ambulatory Visit (INDEPENDENT_AMBULATORY_CARE_PROVIDER_SITE_OTHER): Payer: 59 | Admitting: Student

## 2023-05-04 ENCOUNTER — Encounter: Payer: Self-pay | Admitting: Student

## 2023-05-04 VITALS — BP 123/75 | HR 85 | Ht 70.0 in | Wt 238.2 lb

## 2023-05-04 DIAGNOSIS — M25562 Pain in left knee: Secondary | ICD-10-CM | POA: Diagnosis not present

## 2023-05-04 DIAGNOSIS — M25561 Pain in right knee: Secondary | ICD-10-CM

## 2023-05-04 DIAGNOSIS — M357 Hypermobility syndrome: Secondary | ICD-10-CM | POA: Insufficient documentation

## 2023-05-04 DIAGNOSIS — D508 Other iron deficiency anemias: Secondary | ICD-10-CM | POA: Diagnosis not present

## 2023-05-04 DIAGNOSIS — G8929 Other chronic pain: Secondary | ICD-10-CM

## 2023-05-04 LAB — POCT HEMOGLOBIN: Hemoglobin: 8.9 g/dL — AB (ref 11–14.6)

## 2023-05-04 MED ORDER — DICLOFENAC SODIUM 1 % EX GEL: 4.0000 g | Freq: Four times a day (QID) | CUTANEOUS | 2 refills | Status: DC

## 2023-05-04 NOTE — Patient Instructions (Signed)
Pleasure to meet you.  For your hips and knee pain I suspect this could be attributed to your work, and your hypermobility syndrome.  For your hypermobility syndrome I have sent in referral for genetic testing.  Please look out for a call from the genetic people to schedule appointment.  Also have sent in prescription for Voltaren gel to help with the knee pain and joint pains that you are having.  I recommend increased activity or exercising to help with weight loss as this would be beneficial with your joint pains.  Your last hemoglobin was low at 7.6.  Your hemoglobin today was improved to 8.9.  I would recommend rechecking your thyroid exam in 6 weeks.

## 2023-05-04 NOTE — Assessment & Plan Note (Signed)
Symptoms are improved since discharge from the urgent care.  Patient seems to be doing well with home exercise and meloxicam.  Suspect this chronic joint pain could be attributed to her hypermobility syndrome with notable joint laxity on exam. -Encourage patient to continue home exercise -Advised patient on exercising as she would benefit from weight loss -Rx Voltaren gel -Discussed option of physical therapy in future.

## 2023-05-04 NOTE — Progress Notes (Signed)
    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  PERTINENT  PMH / PSH: Reviewed   OBJECTIVE:   BP 123/75   Pulse 85   Ht 5\' 10"  (1.778 m)   Wt 238 lb 3.2 oz (108 kg)   LMP 04/03/2023 (Approximate)   SpO2 100%   BMI 34.18 kg/m    Physical Exam General: Alert, well appearing, NAD Cardiovascular: RRR, No Murmurs, Normal S2/S2 Respiratory: CTAB, No wheezing or Rales Abdomen: No distension or tenderness Extremities: Joint laxity noted on exam especially with the hands.  Double tenderness on the lateral aspect of the knee joints bilaterally and over the greater trunk arterial bilaterally.  Otherwise 5 out of 5 strength in all extremities with sensation intact.  ASSESSMENT/PLAN:   Hypermobility syndrome Joint laxity noted on exam and patient with known history of hypermobility syndrome.  Given this finding patient expressed desire for genetic testing for Ehlers-Danlos. -Placed ambulatory referral to genetics  Iron deficiency anemia History of significant iron deficiency anemia likely secondary to heavy menses.  Recent iron studies showed significant deficiency and patient's has since received transfusion therapy and currently on daily oral iron supplements.  Will globin today was improved at 8.9. -Point-of-care hemoglobin obtained -Recommend rechecking ferritin studies in 6 weeks  Chronic pain of both knees Symptoms are improved since discharge from the urgent care.  Patient seems to be doing well with home exercise and meloxicam.  Suspect this chronic joint pain could be attributed to her hypermobility syndrome with notable  joint laxity on exam. -Encourage patient to continue home exercise -Advised patient on exercising as she would benefit from weight loss -Rx Voltaren gel -Discussed option of physical therapy in future.    Jerre Simon, MD [facility]

## 2023-05-04 NOTE — Assessment & Plan Note (Signed)
History of significant iron deficiency anemia likely secondary to heavy menses.  Recent iron studies showed significant deficiency and patient's has since received transfusion therapy and currently on daily oral iron supplements.  Will globin today was improved at 8.9. -Point-of-care hemoglobin obtained -Recommend rechecking ferritin studies in 6 weeks

## 2023-05-04 NOTE — Assessment & Plan Note (Signed)
Joint laxity noted on exam and patient with known history of hypermobility syndrome.  Given this finding patient expressed desire for genetic testing for Ehlers-Danlos. -Placed ambulatory referral to genetics

## 2023-06-05 ENCOUNTER — Encounter: Payer: Self-pay | Admitting: Family Medicine

## 2023-06-12 ENCOUNTER — Other Ambulatory Visit: Payer: Self-pay | Admitting: Family Medicine

## 2023-06-12 DIAGNOSIS — N946 Dysmenorrhea, unspecified: Secondary | ICD-10-CM

## 2023-06-12 MED ORDER — MELOXICAM 15 MG PO TABS: 15.0000 mg | ORAL_TABLET | Freq: Every day | ORAL | 1 refills | Status: DC | PRN

## 2023-06-19 ENCOUNTER — Ambulatory Visit: Payer: 59 | Admitting: Student

## 2023-06-21 ENCOUNTER — Ambulatory Visit (INDEPENDENT_AMBULATORY_CARE_PROVIDER_SITE_OTHER): Payer: 59 | Admitting: Family Medicine

## 2023-06-21 VITALS — BP 121/80 | HR 93 | Temp 98.4°F | Ht 70.0 in | Wt 240.8 lb

## 2023-06-21 DIAGNOSIS — N939 Abnormal uterine and vaginal bleeding, unspecified: Secondary | ICD-10-CM | POA: Diagnosis not present

## 2023-06-21 DIAGNOSIS — R300 Dysuria: Secondary | ICD-10-CM | POA: Diagnosis not present

## 2023-06-21 LAB — POCT URINALYSIS DIP (MANUAL ENTRY)
Bilirubin, UA: NEGATIVE
Glucose, UA: NEGATIVE mg/dL
Ketones, POC UA: NEGATIVE mg/dL
Nitrite, UA: NEGATIVE
Spec Grav, UA: 1.02 (ref 1.010–1.025)
Urobilinogen, UA: 0.2 U/dL
pH, UA: 7.5 (ref 5.0–8.0)

## 2023-06-21 LAB — POCT UA - MICROSCOPIC ONLY

## 2023-06-21 MED ORDER — CEPHALEXIN 500 MG PO CAPS: 500.0000 mg | ORAL_CAPSULE | Freq: Two times a day (BID) | ORAL | 0 refills | Status: AC

## 2023-06-21 NOTE — Assessment & Plan Note (Signed)
Reassuringly hemodynamically stable with only one episode of bleeding.  While unlikely to be from UTI alone, patient does have suggestive findings on UA today.  Urine cultures been sent.  Menstrual period that is 2 weeks out from LMP unlikely normal variation in periods.  After shared decision making with patient in light of his family history of cancer, he decided to proceed with transvaginal ultrasound.  Will treat UTI with Keflex 500 mg twice daily for 7 days.  Follow-up if worsening before test results return.

## 2023-06-21 NOTE — Progress Notes (Signed)
    SUBJECTIVE:   CHIEF COMPLAINT / HPI:   Vaginal bleeding A week and a half ago, he felt some pain around his belly button. Thought he had a UTI. At that time, did not have any dysuria or frequency, but he did have a harder time urinating. Two days ago, the pain became sharp only in that area. Noticed yesterday had a lot of vaginal bleeding that resembled a period, but he just had the period 2 weeks ago. The period at the time was lighter than usual. He is not sexually active, and he is a virgin. Also has had some difficulty with stooling, as he feels more constipated. No blood in the stool. This has never happened before. No trauma to the area. Has been having more headaches. Was recently prescribed meloxicam for leg pain, which he found helped the period heaviness, and he took one dose yesterday after the bleeding. No fevers. No bleeding anywhere else.   PERTINENT  PMH / PSH: does have a history of heavy menstrual bleeding with IDA requiring iron infusion  OBJECTIVE:   BP 121/80   Pulse 93   Temp 98.4 F (36.9 C)   Ht 5\' 10"  (1.778 m)   Wt 240 lb 12.8 oz (109.2 kg)   SpO2 98%   BMI 34.55 kg/m   General: Alert and oriented, in NAD Skin: Warm, dry, and intact without lesions HEENT: NCAT, EOM grossly normal, midline nasal septum Cardiac: RRR, no m/r/g appreciated Respiratory: CTAB, breathing and speaking comfortably on RA Abdominal: Soft, nontender, nondistended, normoactive bowel sounds Genitourinary: External vagina normal, nulliparous vaginal introitus with blood in vaginal vault without ability to visualize cervix due to patient discomfort Extremities: Moves all extremities grossly equally Neurological: No gross focal deficit Psychiatric: Appropriate mood and affect   ASSESSMENT/PLAN:   Vaginal bleeding, UTI Reassuringly hemodynamically stable with only one episode of bleeding.  While unlikely to be from UTI alone, patient does have suggestive findings on UA today.  Urine  cultures been sent.  Menstrual period that is 2 weeks out from LMP unlikely normal variation in periods.  After shared decision making with patient in light of his family history of cancer, he decided to proceed with transvaginal ultrasound.  Will treat UTI with Keflex 500 mg twice daily for 7 days.  Follow-up if worsening before test results return.  Janeal Holmes, MD [facility]

## 2023-06-21 NOTE — Patient Instructions (Signed)
You have a urinary tract infection.  This could be contributing to the bleeding.  However, because it has stopped, I would like to treat for the UTI and see if the bleeding recurs.  I have sent in a medication called Keflex to take twice a day for 7 days.  Let me know if your symptoms worsen or do not improve during the time taking antibiotics.

## 2023-06-22 ENCOUNTER — Ambulatory Visit: Payer: 59 | Admitting: Student

## 2023-06-22 NOTE — Addendum Note (Signed)
Addended by: Evette Georges B on: 06/22/2023 08:12 AM   Modules accepted: Orders

## 2023-06-23 LAB — URINE CULTURE

## 2023-06-25 ENCOUNTER — Encounter: Payer: Self-pay | Admitting: Family Medicine

## 2023-06-28 ENCOUNTER — Ambulatory Visit: Payer: 59 | Admitting: Medical Genetics

## 2023-06-28 ENCOUNTER — Ambulatory Visit (INDEPENDENT_AMBULATORY_CARE_PROVIDER_SITE_OTHER): Payer: 59 | Admitting: Medical Genetics

## 2023-06-28 ENCOUNTER — Ambulatory Visit
Admission: RE | Admit: 2023-06-28 | Discharge: 2023-06-28 | Disposition: A | Discharge status: Home / Self Care | Payer: 59 | Source: Ambulatory Visit | Attending: Family Medicine | Admitting: Family Medicine

## 2023-06-28 DIAGNOSIS — Z8249 Family history of ischemic heart disease and other diseases of the circulatory system: Secondary | ICD-10-CM | POA: Diagnosis not present

## 2023-06-28 DIAGNOSIS — R29898 Other symptoms and signs involving the musculoskeletal system: Secondary | ICD-10-CM | POA: Diagnosis not present

## 2023-06-28 DIAGNOSIS — Z1379 Encounter for other screening for genetic and chromosomal anomalies: Secondary | ICD-10-CM

## 2023-06-28 DIAGNOSIS — N939 Abnormal uterine and vaginal bleeding, unspecified: Secondary | ICD-10-CM

## 2023-06-28 DIAGNOSIS — Q7962 Hypermobile Ehlers-Danlos syndrome: Secondary | ICD-10-CM | POA: Diagnosis not present

## 2023-06-28 DIAGNOSIS — G8929 Other chronic pain: Secondary | ICD-10-CM

## 2023-06-28 DIAGNOSIS — M25562 Pain in left knee: Secondary | ICD-10-CM

## 2023-06-28 DIAGNOSIS — M25561 Pain in right knee: Secondary | ICD-10-CM

## 2023-06-28 NOTE — Progress Notes (Signed)
 MEDICAL GENETICS NEW PATIENT EVALUATION  Patient name: Diane Zuniga DOB: 24-Apr-2001 Age: 23 y.o. 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 There is no known genetic cause to this type of EDS, however we will proceed with testing of the other types of EDS for which there is a known genetic cause. Suggestions regarding management were discussed (see below), and include physical therapy, bracing of joints, OTC pain medications, and low-impact activities. A copy of 'The Joint Hypermobility Handbook' can be used a reference along with the EDS Society website (www.ehlers-danlos.com).   Additional issues seen in patients with hypermobile EDS include autonomic dysfunction. Symptoms of autonomic dysfunction can include postural orthostatic tachycardia syndrome (POTS), asthma-type symptoms, GI dysmotility, color changes to hands/feet, and temperature dysregulation. Diane Zuniga appears to have some autonomic symptoms affecting his overall health. Treatment for autonomic dysfunction is generally supportive, but may require referrals to appropriate specialists as needed (e.g., cardiology, gastroenterology, etc).  Given the family history of cardiomyopathy in his mother and sibling, we will also proceed with genetic testing of a panel of genes associated with various types of cardiomyopathies.  Recommendations Comprehensive cardiomyopathy and EDS testing through [facility] - results expected in 1 month.  Resources for patients and providers 'The Joint Hypermobility Handbook' by Arvella Kohler, 2010 (available through various bookstores) The EDS Society website  (www.ehlers-danlos.com) Hypermobility Happy Hour podcast (www.hypermobilityhappyhour.com)  GeneReviews on hypEDS (Favoritechefs.gl) ECHO project through the EDS Society (https://www.ehlers-danlos.com/echo/) EDS Guide for Parents/Educators (https://www.chronicpainpartners.com/[place] -eds-support-group/files/2014/09/EDS-Parent-Guide-for-Educators.pdf)  For pain and joint symptoms Referral to physical therapy for joint stabilization as needed; orthopedics evaluation as needed [facility] can offer exercise interventions for joint stability, muscle strength, and conditioning (phone: [phone]) Medication: Ibuprofen /Tylenol as needed Magnesium  (Epsom salt baths, transdermal oil/cream, oral) Low-impact activities: swimming, biking, walking, golf, elliptical, rowing, etc Braces on various joint as needed (e.g., fingers, knees), arch support for flat feet (shoes, inserts); cushions Consider a referral for cognitive behavioral therapy for pain management For school-aged children: modifications for school as needed (e.g., PE, increased time between classes, use of an elevator, etc)  'Managing Chronic Pain: A Cognitive-Behavioral Therapy Approach Workbook (Treatments That Work)' by Norleen Hash (available through various bookstores) Activity pacing (https://painhealth.https://www.george-aguilar.net/) EDS pain and symptom management (http://www.garcia-cox.com/)  For symptoms of autonomic dysfunction Postural orthostatic tachycardia syndrome: increased water and salt intake, compression stockings, walking, cardiology evaluation as needed for additional management  Abdominal symptoms: small frequent meals, diets involving less inflammatory foods, trial of reducing dairy, gluten, sugars, etc; consider a referral to gastroenterology or nutritionist to ensure a healthy diet that minimally induces GI  symptoms and inflammation Optimize sleep quality Other treatments to consider: massage therapy/bodywork (trigger point therapy, craniosacral, acupuncture, etc), biofeedback, meditation/breathing exercises, increasing vagal tone (humming, singing, speaking, cold water to the face, exercise - walking, meditation, massage, appropriate relationships/socializing)   HPI: Diane Zuniga is a 23 y.o. assigned female at birth who presents today for an initial genetics evaluation for family history of cardiomyopathy and joint hypermobility. He provided the history. This information, along with a review of pertinent records, labs, and radiology studies, is summarized below.  Diane Zuniga has had joint pain for as long as he can remember. Areas that are causing the most pain are in their back, feet, and hands.  Diane Zuniga has also had several injuries and was diagnosed with joint hypermobility by a sports medicine provider in 2021. The reason for that visit was due to joint pain. Scoliosis was also a concern, and spinal X-rays showed mild S shaped thoracolumbar spine curvature. Their injuries include hyperextending his knees, and their shoulder and MCP joints can become subluxed easily. He takes OTC pain meds as needed which provides some relief. He has been referred to PT in 2021 with some improvement, but has not able to keep up with the exercises due to working and being a consulting civil engineer (both full-time). He has difficulty walking up stairs and using his hands to manipulate objects. He works at [employer] full time as well (packing objects, pushing carts, lifting boxes). The work causes pain in various joints. He has accommodations for sitting 5 minutes per hour.  Diane Zuniga has various other medical concerns. He has anemia. Sometimes when he stands up he feels dizzy. Stooling appears to be normal, but he does have some bladder incontinence at times if waiting too long to use the bathroom. There are sometimes hot/cold periods. He does not  experience any rashes. He has had stretch marks over the past few years, even with no weight gain. He bruises easily.  Diane Zuniga also has a diagnosis of autism spectrum disorder and ADHD. 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 He does most of his work online due to having to walk around campus for classes.   Past Medical History: Past Medical History:  Diagnosis Date   Anxiety    Back pain 03/31/2020   COVID-19 vaccine administered 05/26/2020   Family history of cardiomyopathy    Family history of prostate cancer    Family history of uterine cancer    Iron deficiency anemia    Left knee pain 03/31/2020   Palpitations    Patient Active Problem List   Diagnosis Date Noted   Vaginal bleeding 06/21/2023   Hypermobility syndrome 05/04/2023   Fatigue 04/10/2023   Acute cystitis without hematuria 06/26/2022   Need for Tdap vaccination 06/26/2022   Painful menstrual periods 10/06/2021   Healthcare maintenance 10/06/2021   Abnormality of gait 02/03/2021   Genetic testing 12/05/2020   Family history of prostate cancer 11/19/2020   Family history of uterine cancer 10/28/2020   Iron deficiency anemia 09/29/2020   Weakness of both hands 09/16/2020   Family history of cardiomyopathy 04/30/2020   Anxiety 04/30/2020   Chronic pain of both knees 03/31/2020   Past Surgical History:  Past Surgical History:  Procedure Laterality Date   WISDOM TOOTH EXTRACTION     Medications: Current Outpatient Medications on File Prior to Visit  Medication Sig Dispense Refill   buPROPion  (WELLBUTRIN  XL) 300 MG 24 hr tablet Take 1 tablet (300 mg total) by mouth daily. (Patient not taking: Reported on 06/23/2022) 90 tablet 3   busPIRone  (BUSPAR ) 7.5 MG tablet TAKE 1 TABLET(7.5 MG) BY MOUTH TWICE DAILY 60 tablet 2   cephALEXin  (KEFLEX ) 500 MG capsule Take 1 capsule (500 mg total) by mouth 2 (two) times daily for 7 days. 14 capsule 0   diclofenac  Sodium (VOLTAREN ) 1 % GEL Apply 4 g topically 4  (four) times daily. 50 g 2   EPINEPHrine  0.3 mg/0.3 mL IJ SOAJ injection Inject 0.3 mg into the muscle as needed for anaphylaxis. (Patient not taking: Reported on 09/12/2022) 1 each 1   ferrous sulfate  220 (44 Fe) MG/5ML solution Take 5 mLs (220 mg total) by mouth daily.  150 mL 3   FLUoxetine  (PROZAC ) 40 MG capsule Take 1 capsule (40 mg total) by mouth daily. 90 capsule 3   meloxicam  (MOBIC ) 15 MG tablet Take 1 tablet (15 mg total) by mouth daily as needed for pain (Severe menstrual cramps (please do not use more than 3 days out of the month)). 30 tablet 1   No current facility-administered medications on file prior to visit.   Allergies:  Allergies  Allergen Reactions   Citrus Cough, Shortness Of Breath and Swelling   Immunizations: Up to date  Review of Systems: Negative except as noted in the HPI  Family History: Family History  Problem Relation Age of Onset   Cardiomyopathy Mother 31   Cardiomyopathy Brother 56   Autism Brother    Fibroids Paternal Aunt    Uterine cancer Paternal Aunt        dx late 64s   Fibroids Paternal Aunt    Fibroids Paternal Aunt    Fibroids Paternal Aunt    Uterine cancer Paternal Grandmother        dx 54s, hysterectomy   Prostate cancer Paternal Grandfather 40       metastatic   Cancer Other        hysterectomy due to cancer (dx 60s) in 3 paternal great-aunts   Diabetes Half-Brother    Uterine cancer Paternal Great-grandmother        hysterectomy  Self-reported ancestry: African-American Consanguinity: Denies Please see the genetic counselor note for additional information  Social History: Lives by himself in [place] Physical Exam:  Constitution: The patient is active and alert  Head: No abnormalities detected in: head, hairline, shape or size    Anterior fontanelle flat: not flat    Anterior fontanelle open: not open    Bitemporal narrowing: forehead not narrow    Frontal bossing: no frontal bossing    Macrocephaly:  not macrocephalic    Microcephaly: not microcephalic    Plagiocephaly: not plagiocephalic  Face: No abnormalities detected in: face, midface or shape    Coarse facial features: no coarse facies    Midfacial hypoplasia: no midfacial hypoplasia  Mouth: (comments: Wide space between upper central incisors, normal palate, no dental crowding)  Neck: No abnormalities detected in: neck    Cysts: no cysts    Pits: no pits in neck    Redundant nuchal skin: no redundant neck skin    Webbing: no webbed neck  Chest: No abnormalities detected in: chest, appearance, clavicles or scapulae    Inverted nipples: nipples not inverted    Pectus excavatum: no pectus excavatum  Cardiac: No abnormalities detected in: cardiovascular system    Abnormal distal perfusion: normal distal perfusion    Irregular rate: heart rate regular    Irregular rhythm: regular rhythm    Murmur: no murmur  Lungs: No abnormalities detected in: pulmonary system, bilateral auscultation or effort  Abdomen: No abnormalities detected in: abdomen or appearance    Abnormal umbilicus: normal umbilicus    Diastasis recti: no diastasis recti    Distended abdomen: no distension    Hepatosplenomegaly: no hepatosplenomegaly    Umbilical hernia: no umbilical hernia  Spine: (comments: Accentuated lumbar lordosis)  Neurological: No abnormalities detected in: antigravity movement of extremities, strength or facial movement (comments: Brisk DTRs)  Genitourinary: not assessed  Hair, Nails, and Skin: (comments: Striae on upper arms and around trunk, soft skin, slightly elastic skin, piezogenic papules, no scars)  Extremities: (comments: Beighton: 6/8 (unable to assess forward bend), fingers 2,  wrists 2, elbows 0, knees 2;  AS 189.8, Ht 5'10, AS/Ht: 1.067 (elevated))  Hands and Feet: No abnormalities detected in: distal extremities    Clinodactyly: no clinodactyly    Polydactyly: no polydactyly    Single palmar  crease: multiple palmar creases    Syndactyly: no syndactyly   Caedin Mogan Haldeman-Englert, MD [facility]/Genetics Date: 06/28/2023 Time: 1630   Total time spent: 60 minutes Time spent includes face to face and non-face to face care for the patient on the date of this encounter (history and physical, genetic counseling, coordination of care, data gathering and/or documentation as outlined).  Genetic counselor: [name], MS, [facility]

## 2023-06-28 NOTE — Progress Notes (Signed)
 GENETIC COUNSELING NEW PATIENT EVALUATION Patient name: Diane Zuniga DOB: 2000/12/22 Age: 23 y.o. MRN: [id number]  Referring Provider/Specialty: Suzann Daring, MD  Date of Evaluation: 06/28/2023 Chief Complaint/Reason for Referral: Joint hypermobility, autism spectrum disorder, family history of cardiomyopathy   Brief Summary:Diane Zuniga is a 23 y.o. adult who presents today for an initial genetics evaluation for joint hypermobility, autism spectrum disorder, and a family history of cardiomyopathy. He is unaccompanied at today's visit.   Prior genetic testing has been performed. Diane Zuniga has had previous genetic testing for hereditary cancer conditions.  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   Family History:  The family history was notable for the following: Brother, 41 yo, with autism spectrum disorder and cardiomyopathy (unknown type) requiring hospitalization. Brother, 20 yo, alive and well. Brohter, 24 yo, alive and well.  Paternal Family History Father, 74 yo, alive and well. Aunt, 43 yo, with uterine fibroids. Aunt, 83 yo, with uterine fibroids. Aunt, 52 yo, with uterine fibroids. Aunt, 62 yo, with uterine cancer diagnosed in her late 87s. Grandfather, deceased at 37 yo, with metastatic prostate cancer at 92 yo. Grandmother, deceased at 7 yo, with uterine cancer diagnosed in her 10s requiring hysterectomy and lung cancer at 23 yo.  Maternal Family History Mother, deceased at 14 yo, from cardiomyopathy, unknown type.  She is adopted and there no further known maternal family history. Half-brother, deceased at 49 yo from diabetes.  Mother's ethnicity: African American, Mixed European Father's ethnicity: African American Consangunity: Denies   Prior Genetic testing: [facility] Multi-Cancer + RNA panel (84 genes): non-diagnostic. Variant of uncertain significance in CDKN2A  (c.400G>A).  Still classified as a VUS in February 2025.  Genetic Counseling: Diane Zuniga is a 23 y.o. adult with a personal history of autism spectrum disorder and hypermobility.  Diane Zuniga has been diagnosed with autism spectrum disorder and ADHD but considers himself to be, high-functioning.  He also reports significant chronic pain and joint hypermobility.  Diane Zuniga has always had significant joint pain, especially in his hands, feet, and back.  A sports medicine physician diagnosed them with joint hypermobility in 2022.  Walking long distances, standing for extended periods of time, and climbing stairs are very difficult for Diane Zuniga.  They have had to transition to online college courses to avoid walking across campus and climbing stairs.  He has attempted to treat his symptoms with OTC pain medications but tries to limited their use.  PT has been helped in the past, however Diane Zuniga has found it difficult to make time for PT sessions and at-home exercises.  Diane Zuniga also reports lightheadedness when standing up and stretch marks.  Diane Zuniga has had previous hereditary cancer genetic testing due to a family history of uterine and metastatic prostate cancer.  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  This variant is still classified as a VUS in February 2025.  We discussed that Diane Zuniga meets criteria for a diagnosis of hypermobile Ehlers Danlos Zuniga (hEDS).  hEDS is a connective tissue disorder that causes generalized joint hypermobility, joint instability, and chronic pain. hEDS is also associated with a variety of other symptoms and related conditions that affect many different areas of the body including mast cell activation, and postural orthostatic tachycardia Zuniga (POTS). hEDS is the most common form of Ehlers Danlos Zuniga and is likely multifactorial in nature, meaning that a combination of genetic and environmental factors cause  symptoms, rather than a single genetic cause.  There are other forms of EDS including vascular and classical EDS, that do have known genetic causes.  Genetic testing to rule out these types is available.  Diane Zuniga also has a family history of cardiomyopathy.  Their mother passed at 32 yo due to an unknown type of cardiomyopathy.  His brother, 73 yo, has also been diagnosed with cardiomyopathy and has required hospitalization at least once due to his symptoms.  Diane Zuniga has had a normal echocardiogram in 2022.  We discussed that, in some families, genetic changes that increase the risk for cardiomyopathy are present.  Genetic testing for these conditions is available.  We discussed that negative/normal genetic testing related to cardiomyopathy in [name] does not preclude a genetic diagnosis in other members of the family.  It is possible that their mother and brother possess a genetic change that Diane Zuniga did not inherit.  He plans to speak with his brother to inform him of the utility of genetic testing.  Diane Zuniga was interested in genetic testing for both Diane Zuniga and cardiomyopathy.  After discussion of the types of results, expected timeline, and cost, Diane Zuniga provided verbal consent for testing.  A buccal sample was provided to be sent to [facility] for the [facility] Danlos Zuniga Panel (17 genes) and the Cardiomyopathy Comprehensive Panel (82 genes).  Results are expected within 2-4 weeks at which point we will reach out with more information.  Recommendations: [facility] Ehlers Danlos Zuniga Panel (17 genes) and Cardiomyopathy Comprehensive Panel (82 genes). Consider physical therapy. Continue follow-up with other healthcare providers as recommended.  Date: 06/28/2023 Total time spent: 63 minutes Genetic Counselor-only time: 18 minutes  Time spent includes face to face and non-face to face care for the patient on the date of this encounter (history, genetic counseling, coordination of care, data gathering and/or  documentation as outlined).   [name] MS [facility] Certified Genetic Counselor [facility] [employer]

## 2023-06-29 ENCOUNTER — Encounter: Payer: Self-pay | Admitting: Family Medicine

## 2023-07-09 ENCOUNTER — Other Ambulatory Visit: Payer: Self-pay | Admitting: Family Medicine

## 2023-07-12 ENCOUNTER — Ambulatory Visit: Payer: 59 | Admitting: Genetic Counselor

## 2023-07-23 ENCOUNTER — Telehealth: Payer: Self-pay | Admitting: Genetic Counselor

## 2023-07-23 NOTE — Progress Notes (Signed)
 Attempted to speak with Diane Zuniga, "[employer], regarding the results of his genetic testing.  He reported that he could not speak at this moment and asked for a call back on Thursday 3/6.  I will call him then to discuss his results in detail.  Tilda Franco, MS [facility] Certified Genetic Counselor

## 2023-07-24 ENCOUNTER — Encounter: Payer: Self-pay | Admitting: Genetic Counselor

## 2023-07-26 NOTE — Progress Notes (Addendum)
 Spoke with Diane Zuniga, regarding the results of his recent genetic testing.   Diane Zuniga was seen in the [facility] clinic on 06/28/2023 at 23 yo due to a personal history of joint hypermobility and a family history of cardiomyopathy.  After evaluation, genetic testing was ordered for [facility] including two gene panels.   The [facility] Ehlers Danlos syndrome Panel was non-diagnostic.  At this time, we have not identified a genetic cause for  Diane Zuniga's personal history of joint hypermobility.  No changes to medical management are recommended based on this result.   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  Pathogenic variants in the COL12A1 gene are associated with autosomal dominant [place] myopathy 2.  Individuals with this condition experience congenital hypotonia and myopathy. Diane Zuniga has not reported symptoms associated with this condition. Most VUS results are found to be benign once more individuals with the same variant are identified. Given this information, we do not expect Diane Zuniga to have Beth[place] myopathy 2 at this time.     We discussed that connective tissue differences are often multifactorial, meaning that a combination of genetic and environmental factors, together, cause symptoms rather than one single genetic condition.   The [facility] Cardiomyopathy Comprehensive Panel was non-diagnostic.  At this time, we have not identified a genetic cause for Diane Zuniga 's family history of cardiomyopathy.  No changes to medical management are recommended based on this result.   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  The first is in the DSP gene (c.1985A>G). Pathogenic variants in the DSP gene are associated with autosomal dominant arrhthymogenic right ventricular cardiomyopathy and autosomal dominant dilated cardiomyopathy with wooly hair, keratoderma, and tooth agenesis. The second is in the  [facility] gene (c.706-1G>T).  Pathogenic variants in the TMEM43 gene are associated with autosomal dominant arrhythmogenic right ventricular cardiomyopathy. Diane Zuniga has not reported symptoms associated with either condition. Most VUS results are found to be benign once more individuals with the same variant are identified. Given this information, we do not expect Diane Zuniga to have these conditions at this time.     We did discuss that genetic testing of Diane Zuniga's affected relatives would provide more information for the family.  He reported that his brother recently had genetic testing for cardiomyopathy and plans to share his report with our clinic for interpretation. If his brother is found to have a pathogenic variant, targeted testing may be possible for Diane Zuniga and other family members.   Diane Zuniga expressed understanding of these results and was encouraged to reach out with any further questions.  The test report has been released and is attached to the associated order.   Tilda Franco, MS [facility]  Certified Genetic Counselor

## 2023-07-30 ENCOUNTER — Encounter: Payer: Self-pay | Admitting: Family Medicine

## 2023-11-02 NOTE — Telephone Encounter (Signed)
 error

## 2023-11-30 ENCOUNTER — Encounter (HOSPITAL_COMMUNITY): Payer: Self-pay | Admitting: Emergency Medicine

## 2023-11-30 ENCOUNTER — Ambulatory Visit (HOSPITAL_COMMUNITY)
Admission: EM | Admit: 2023-11-30 | Discharge: 2023-11-30 | Disposition: A | Discharge status: Home / Self Care | Attending: Emergency Medicine | Admitting: Emergency Medicine

## 2023-11-30 DIAGNOSIS — E860 Dehydration: Secondary | ICD-10-CM | POA: Insufficient documentation

## 2023-11-30 DIAGNOSIS — R42 Dizziness and giddiness: Secondary | ICD-10-CM | POA: Insufficient documentation

## 2023-11-30 DIAGNOSIS — R519 Headache, unspecified: Secondary | ICD-10-CM | POA: Insufficient documentation

## 2023-11-30 LAB — CBC WITH DIFFERENTIAL/PLATELET
Abs Immature Granulocytes: 0.01 K/uL (ref 0.00–0.07)
Basophils Absolute: 0 K/uL (ref 0.0–0.1)
Basophils Relative: 1 %
Eosinophils Absolute: 0.1 K/uL (ref 0.0–0.5)
Eosinophils Relative: 1 %
HCT: 36.7 % (ref 36.0–46.0)
Hemoglobin: 11.2 g/dL — ABNORMAL LOW (ref 12.0–15.0)
Immature Granulocytes: 0 %
Lymphocytes Relative: 29 %
Lymphs Abs: 1.7 K/uL (ref 0.7–4.0)
MCH: 24.3 pg — ABNORMAL LOW (ref 26.0–34.0)
MCHC: 30.5 g/dL (ref 30.0–36.0)
MCV: 79.8 fL — ABNORMAL LOW (ref 80.0–100.0)
Monocytes Absolute: 0.4 K/uL (ref 0.1–1.0)
Monocytes Relative: 7 %
Neutro Abs: 3.5 K/uL (ref 1.7–7.7)
Neutrophils Relative %: 62 %
Platelets: 447 K/uL — ABNORMAL HIGH (ref 150–400)
RBC: 4.6 MIL/uL (ref 3.87–5.11)
RDW: 15.9 % — ABNORMAL HIGH (ref 11.5–15.5)
WBC: 5.7 K/uL (ref 4.0–10.5)
nRBC: 0 % (ref 0.0–0.2)

## 2023-11-30 LAB — POCT FASTING CBG KUC MANUAL ENTRY: POCT Glucose (KUC): 132 mg/dL — AB (ref 70–99)

## 2023-11-30 LAB — POCT URINALYSIS DIP (MANUAL ENTRY)
Bilirubin, UA: NEGATIVE
Glucose, UA: NEGATIVE mg/dL
Ketones, POC UA: NEGATIVE mg/dL
Nitrite, UA: NEGATIVE
Protein Ur, POC: NEGATIVE mg/dL
Spec Grav, UA: >=1.03 — AB (ref 1.010–1.025)
Urobilinogen, UA: 0.2 U/dL
pH, UA: 5.5 (ref 5.0–8.0)

## 2023-11-30 MED ORDER — ONDANSETRON 4 MG PO TBDP
ORAL_TABLET | ORAL | Status: AC
  Filled 2023-11-30: qty 1

## 2023-11-30 MED ORDER — KETOROLAC TROMETHAMINE 30 MG/ML IJ SOLN
INTRAMUSCULAR | Status: AC
  Filled 2023-11-30: qty 1

## 2023-11-30 MED ORDER — ONDANSETRON 4 MG PO TBDP: 4.0000 mg | ORAL_TABLET | Freq: Four times a day (QID) | ORAL | 0 refills | Status: AC | PRN

## 2023-11-30 MED ORDER — ONDANSETRON 4 MG PO TBDP
4.0000 mg | ORAL_TABLET | Freq: Once | ORAL | Status: AC
  Administered 2023-11-30: 4 mg via ORAL

## 2023-11-30 MED ORDER — KETOROLAC TROMETHAMINE 30 MG/ML IJ SOLN
30.0000 mg | Freq: Once | INTRAMUSCULAR | Status: AC
  Administered 2023-11-30: 30 mg via INTRAMUSCULAR

## 2023-11-30 NOTE — Discharge Instructions (Addendum)
 I will call you in a few hours if there is anything abnormal on your blood draw.  In the meantime, please drink lots of water.  He should have at least 64 ounces daily.  Do not use any drinks with sugar as this will dehydrate you further.  The Toradol  injection given today should last for several hours.  I recommend continuing Tylenol every 4-6 hours as needed for headache.  The zofran  can be used every 6 hours as needed to settle the stomach.  Call your primary care provider first thing Monday morning to make a follow-up appointment. If at any point over the weekend your symptoms worsen or become severe, go directly to the emergency department.

## 2023-11-30 NOTE — ED Provider Notes (Signed)
 [facility]    CSN: [id number] Arrival date & time: 11/30/23  1233      History   Chief Complaint Chief Complaint  Patient presents with   Dizziness   Headache    HPI Diane Zuniga is a 23 y.o. adult.  1 week history of intermittent headache and dizziness 2 days ago symptoms worsened. They are better today but still present. Headache is rated 4/10 Dizziness is only when standing quickly or moves head fast Not dizzy at rest.   Initially reported drinking a lot of water. However describes this as 2 cans of sparkling water and sometimes sweet tea. Has been eating smaller portions lately trying to lose weight   Also experiencing nausea. Denies vomiting  Used Pamprin (tylenol, aspirin, caffeine) and regular tylenol without relief   Reports history of anemia. Last hgb was 7 months ago, 8.9. Has not been taking iron supplement.  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   Fatigue 04/10/2023   Acute cystitis without hematuria 06/26/2022   Need for Tdap vaccination 06/26/2022   Painful menstrual periods 10/06/2021   Healthcare maintenance 10/06/2021   Abnormality of gait 02/03/2021   Genetic testing 12/05/2020   Family history of prostate cancer 11/19/2020   Family history of uterine cancer 10/28/2020   Iron deficiency anemia 09/29/2020   Weakness of both hands 09/16/2020   Family history of cardiomyopathy 04/30/2020   Anxiety 04/30/2020   Chronic pain of both knees 03/31/2020    Past Surgical  History:  Procedure Laterality Date   WISDOM TOOTH EXTRACTION      OB History   No obstetric history on file.      Home Medications    Prior to Admission medications   Medication Sig Start Date End Date Taking? Authorizing Provider  ondansetron  (ZOFRAN -ODT) 4 MG disintegrating tablet Take 1 tablet (4 mg total) by mouth every 6 (six) hours as needed for nausea or vomiting. 11/30/23  Yes Demarquis Osley, [place], PA-C  buPROPion  (WELLBUTRIN  XL) 300 MG 24 hr tablet Take 1 tablet (300 mg total) by mouth daily. Patient not taking: Reported on 06/23/2022 10/12/21   Espinoza, Alejandra, DO  busPIRone  (BUSPAR ) 7.5 MG tablet TAKE 1 TABLET(7.5 MG) BY MOUTH TWICE DAILY 04/28/23   Nicholas Bar, MD  EPINEPHrine  0.3 mg/0.3 mL IJ SOAJ injection Inject 0.3 mg into the muscle as needed for anaphylaxis. Patient not taking: Reported on 09/12/2022 09/04/22   Espinoza, Alejandra, DO  ferrous sulfate  220 (44 Fe) MG/5ML solution Take 5 mLs (220 mg total) by mouth daily. 05/03/23   Tharon Lung, MD  FLUoxetine  (PROZAC ) 40 MG capsule Take 1 capsule (40 mg total) by mouth daily. 09/12/22   Espinoza, Alejandra, DO    Family History Family History  Problem Relation Age of Onset   Anemia Mother        unknown, reportedly rare, anemia condition   Cardiomyopathy Mother 25   Cardiomyopathy Brother 17   Autism Brother    Fibroids Paternal Aunt  Uterine cancer Paternal Aunt        dx late 31s   Fibroids Paternal Aunt    Uterine cancer Paternal Aunt 81   Fibroids Paternal Aunt    Cervical cancer Paternal Aunt        precancer at 8 yo   Fibroids Paternal Aunt    Lung cancer Paternal Grandmother 56   Uterine cancer Paternal Grandmother        dx 31s, hysterectomy   Prostate cancer Paternal Grandfather 58       metastatic   Cancer Other        hysterectomy due to cancer (dx 11s) in 3 paternal great-aunts   Diabetes Half-Brother    Uterine cancer Paternal Great-grandmother        hysterectomy    Social  History Social History   Tobacco Use   Smoking status: Never    Passive exposure: Never   Smokeless tobacco: Never  Vaping Use   Vaping status: Never Used  Substance Use Topics   Alcohol use: Not Currently   Drug use: Never     Allergies   Citrus   Review of Systems Review of Systems As per HPI  Physical Exam Triage Vital Signs ED Triage Vitals  Encounter Vitals Group     BP 11/30/23 1247 (!) 149/89     Girls Systolic BP Percentile --      Girls Diastolic BP Percentile --      Boys Systolic BP Percentile --      Boys Diastolic BP Percentile --      Pulse Rate 11/30/23 1247 92     Resp 11/30/23 1247 18     Temp 11/30/23 1247 98.9 F (37.2 C)     Temp Source 11/30/23 1247 Oral     SpO2 11/30/23 1247 97 %     Weight --      Height --      Head Circumference --      Peak Flow --      Pain Score 11/30/23 1245 6     Pain Loc --      Pain Education --      Exclude from Growth Chart --    No data found.  Updated Vital Signs BP (!) 149/89 (BP Location: Left Arm)   Pulse 92   Temp 98.9 F (37.2 C) (Oral)   Resp 18   LMP 11/02/2023 (Exact Date)   SpO2 97%    Physical Exam Vitals and nursing note reviewed.  Constitutional:      General: He is not in acute distress. HENT:     Head: Normocephalic and atraumatic.     Right Ear: Tympanic membrane and ear canal normal.     Left Ear: Tympanic membrane and ear canal normal.     Nose: Nose normal.     Mouth/Throat:     Mouth: Mucous membranes are moist.     Pharynx: Oropharynx is clear.  Eyes:     Extraocular Movements: Extraocular movements intact.     Right eye: Normal extraocular motion and no nystagmus.     Left eye: Normal extraocular motion and no nystagmus.     Conjunctiva/sclera: Conjunctivae normal.     Pupils: Pupils are equal, round, and reactive to light.  Cardiovascular:     Rate and Rhythm: Normal rate and regular rhythm.     Heart sounds: Normal heart sounds.  Pulmonary:     Effort:  Pulmonary effort is normal. No respiratory distress.  Breath sounds: Normal breath sounds.  Abdominal:     Palpations: Abdomen is soft.     Tenderness: There is no abdominal tenderness.  Musculoskeletal:        General: Normal range of motion.     Cervical back: Normal range of motion.  Neurological:     General: No focal deficit present.     Mental Status: He is alert and oriented to person, place, and time.     Cranial Nerves: Cranial nerves 2-12 are intact. No cranial nerve deficit.     Sensory: Sensation is intact.     Motor: Motor function is intact. No weakness or pronator drift.     Coordination: Coordination is intact. Romberg sign negative. Finger-Nose-Finger Test normal.     Gait: Gait is intact.     UC Treatments / Results  Labs (all labs ordered are listed, but only abnormal results are displayed) Labs Reviewed  CBC WITH DIFFERENTIAL/PLATELET - Abnormal; Notable for the following components:      Result Value   Hemoglobin 11.2 (*)    MCV 79.8 (*)    MCH 24.3 (*)    RDW 15.9 (*)    Platelets 447 (*)    All other components within normal limits  POCT URINALYSIS DIP (MANUAL ENTRY) - Abnormal; Notable for the following components:   Clarity, UA cloudy (*)    Spec Grav, UA >=1.030 (*)    Blood, UA small (*)    Leukocytes, UA Small (1+) (*)    All other components within normal limits  POCT FASTING CBG [facility] MANUAL ENTRY - Abnormal; Notable for the following components:   POCT Glucose ([facility]) 132 (*)    All other components within normal limits  URINE CULTURE    EKG  Radiology No results found.  Procedures Procedures   Medications Ordered in UC Medications  ondansetron  (ZOFRAN -ODT) disintegrating tablet 4 mg (4 mg Oral Given 11/30/23 1441)  ketorolac  (TORADOL ) 30 MG/ML injection 30 mg (30 mg Intramuscular Given 11/30/23 1441)    Initial Impression / Assessment and Plan / UC Course  I have reviewed the triage vital signs and the nursing notes.  Pertinent  labs & imaging results that were available during my care of the patient were reviewed by me and considered in my medical decision making (see chart for details).  Stable vitals, neurologically intact. No red flags Consider dehydration as contribute to headache and dizziness. Discussed importance of water intake.  Zofran  ODT given for nausea IM toradol  for pain Pain control at home with tylenol   UA elevated specific gravity. Small leuks and RBC. Not having urinary symptoms at this time. Will culture.  CBG 132  CBC with hgb 11.2, and lower MCH 24.3. Slight elevation in platelets 447. I do think patient is dehydrated, hgb may actually be lower. Suspect IDA unmanaged contributing to symptoms.  Recommend to re-start iron supplement, taking one pill every other day. Increase fiber and fluids.   Recommend call PCP first thing Monday to make follow up appointment.  Strict ED precautions are discussed for any acute worsening in the meantime. All questions answered. Patient agrees to plan  Final Clinical Impressions(s) / UC Diagnoses   Final diagnoses:  Dizziness  Acute nonintractable headache, unspecified headache type  Dehydration     Discharge Instructions      I will call you in a few hours if there is anything abnormal on your blood draw.  In the meantime, please drink lots of water.  He should  have at least 64 ounces daily.  Do not use any drinks with sugar as this will dehydrate you further.  The Toradol  injection given today should last for several hours.  I recommend continuing Tylenol every 4-6 hours as needed for headache.  The zofran  can be used every 6 hours as needed to settle the stomach.  Call your primary care provider first thing Monday morning to make a follow-up appointment. If at any point over the weekend your symptoms worsen or become severe, go directly to the emergency department.     ED Prescriptions     Medication Sig Dispense Auth. Provider    ondansetron  (ZOFRAN -ODT) 4 MG disintegrating tablet Take 1 tablet (4 mg total) by mouth every 6 (six) hours as needed for nausea or vomiting. 20 tablet Falon Flinchum, Asberry, PA-C      PDMP not reviewed this encounter.   Jeryl Asberry, [place] 11/30/23 1813

## 2023-11-30 NOTE — ED Triage Notes (Addendum)
 For week (starting last Thursday) had headache-anterior, dizziness, and nausea. Tried taking pamprin and tylenol that doesn't really help. Reports had to call out of work due to her symptoms.  Pt drove self here today.

## 2023-12-01 ENCOUNTER — Ambulatory Visit (HOSPITAL_COMMUNITY)

## 2023-12-01 ENCOUNTER — Ambulatory Visit: Payer: Self-pay | Admitting: Emergency Medicine

## 2023-12-01 LAB — URINE CULTURE: Culture: 30000 — AB

## 2023-12-18 ENCOUNTER — Ambulatory Visit: Admitting: Family Medicine

## 2023-12-19 ENCOUNTER — Ambulatory Visit

## 2023-12-20 ENCOUNTER — Ambulatory Visit: Payer: Self-pay

## 2023-12-20 ENCOUNTER — Ambulatory Visit (INDEPENDENT_AMBULATORY_CARE_PROVIDER_SITE_OTHER)

## 2023-12-20 VITALS — BP 122/76 | HR 89 | Ht 70.0 in | Wt 255.2 lb

## 2023-12-20 DIAGNOSIS — N390 Urinary tract infection, site not specified: Secondary | ICD-10-CM | POA: Diagnosis not present

## 2023-12-20 LAB — POCT UA - MICROSCOPIC ONLY: WBC, Ur, HPF, POC: >20 (ref 0–5)

## 2023-12-20 LAB — POCT URINALYSIS DIP (MANUAL ENTRY)
Bilirubin, UA: NEGATIVE
Blood, UA: NEGATIVE
Glucose, UA: NEGATIVE mg/dL
Ketones, POC UA: NEGATIVE mg/dL
Nitrite, UA: NEGATIVE
Protein Ur, POC: NEGATIVE mg/dL
Spec Grav, UA: 1.02 (ref 1.010–1.025)
Urobilinogen, UA: 0.2 U/dL
pH, UA: 7 (ref 5.0–8.0)

## 2023-12-20 NOTE — Progress Notes (Signed)
    SUBJECTIVE:   CHIEF COMPLAINT / HPI: UTI  Diane Zuniga is a 23 YO female present at [facility] today w/ c/o UTI. Per patient, about 2 weeks ago she was not feeling well and experiencing some dizziness which prompt her to visit an urgent care and was diagnosed with dehydration. During the visit, UA was collected which the result that shows multiple bacteria. She was told my an urgent care provider that she may have a UTI and suggested her to follow up with her PCP. Diane Zuniga states she noticed that her urine has been smelling bad. She denies fever, abdominal pain, or pain when urination  PERTINENT  PMH / PSH:   Anxiety   Iron deficiency anemia   Family history of uterine cancer   Acute cystitis without hematuria   Hypermobile Ehlers-Danlos syndrome      OBJECTIVE:   BP 122/76   Pulse 89   Ht 5' 10 (1.778 m)   Wt 255 lb 3.2 oz (115.8 kg)   LMP 11/02/2023 (Exact Date)   SpO2 99%   BMI 36.62 kg/m   Physical Exam Constitutional:      Appearance: Normal appearance.  Cardiovascular:     Rate and Rhythm: Normal rate and regular rhythm.     Pulses: Normal pulses.     Heart sounds: Normal heart sounds.  Pulmonary:     Effort: Pulmonary effort is normal.     Breath sounds: Normal breath sounds.  Abdominal:     Palpations: Abdomen is soft.  Skin:    General: Skin is warm and dry.  Neurological:     General: No focal deficit present.     Mental Status: He is alert and oriented to person, place, and time.  Psychiatric:        Mood and Affect: Mood normal.        Behavior: Behavior normal.        Thought Content: Thought content normal.        Judgment: Judgment normal.       ASSESSMENT/PLAN:  Diane Zuniga is a 23 YO female present at [facility] today w/ c/o UTI. Patient states her urine has been smelling bad with c/o UTIs per urgent care provider. Her physical exam was benign. Lab negative for UTIs. Off note Pt is due for her Pap but refused to get it done during this visit Assessment &  Plan Urinary tract infection without hematuria, site unspecified - UA and Urine culture ordered. Negative UA dipstick for UTIs - Follow up PRN   Plan discuss with Dr.Rumball attending physician who agreed with plan.    Glenys Snader, DO PGY 1 Family Medicine Resident [facility]

## 2023-12-20 NOTE — Patient Instructions (Addendum)
   It was great to see you!  Our plans for today:  - Drink plenty of fluid - Your UTIs test is negative so you do not have a UTIs - Recommending follow up visit for Physical and Pap   Take care and seek immediate care sooner if you develop any concerns.     Houston Coralee HAS PGY 1 Family Medicine Resident [facility]  [address] [place], [place] [address] Fax [phone] Phone [phone] 12/20/2023, 11:33 AM

## 2023-12-21 ENCOUNTER — Encounter (HOSPITAL_COMMUNITY): Payer: Self-pay

## 2023-12-21 ENCOUNTER — Ambulatory Visit (HOSPITAL_COMMUNITY)
Admission: EM | Admit: 2023-12-21 | Discharge: 2023-12-21 | Disposition: A | Discharge status: Home / Self Care | Attending: Nurse Practitioner | Admitting: Nurse Practitioner

## 2023-12-21 DIAGNOSIS — S161XXA Strain of muscle, fascia and tendon at neck level, initial encounter: Secondary | ICD-10-CM

## 2023-12-21 MED ORDER — DEXAMETHASONE SODIUM PHOSPHATE 10 MG/ML IJ SOLN
INTRAMUSCULAR | Status: AC
  Filled 2023-12-21: qty 1

## 2023-12-21 MED ORDER — KETOROLAC TROMETHAMINE 60 MG/2ML IM SOLN
INTRAMUSCULAR | Status: AC
  Filled 2023-12-21: qty 2

## 2023-12-21 MED ORDER — NAPROXEN 500 MG PO TABS: 500.0000 mg | ORAL_TABLET | Freq: Two times a day (BID) | ORAL | 0 refills | Status: AC

## 2023-12-21 MED ORDER — CYCLOBENZAPRINE HCL 10 MG PO TABS: 10.0000 mg | ORAL_TABLET | Freq: Three times a day (TID) | ORAL | 0 refills | Status: AC | PRN

## 2023-12-21 MED ORDER — DEXAMETHASONE SODIUM PHOSPHATE 10 MG/ML IJ SOLN
10.0000 mg | Freq: Once | INTRAMUSCULAR | Status: AC
  Administered 2023-12-21: 10 mg via INTRAMUSCULAR

## 2023-12-21 MED ORDER — KETOROLAC TROMETHAMINE 60 MG/2ML IM SOLN
60.0000 mg | Freq: Once | INTRAMUSCULAR | Status: AC
  Administered 2023-12-21: 60 mg via INTRAMUSCULAR

## 2023-12-21 NOTE — Discharge Instructions (Addendum)
 You are being treated for acute neck pain and muscle spasm, likely due to a cervical strain. You were given injections today to help reduce pain and inflammation. You have been prescribed naproxen to take twice daily--do not take additional over-the-counter NSAIDs such as ibuprofen  or aspirin while on this medication. You may take Tylenol if you need additional pain relief. Flexeril was also prescribed to help relax your muscles; take it three times a day for the next two days, then only as needed. This medication can cause drowsiness, so you may take it just at bedtime once you return to work on Monday.   Apply moist heat to your neck by placing a damp washcloth over the area and covering it with a heating pad set to low. Use for 15-20 minutes every few hours as tolerated. Avoid strenuous activity, sudden neck movements, or heavy lifting until symptoms improve. Begin gentle neck stretches and exercises as directed in your handout once pain has decreased.  Follow up with your primary care provider if symptoms do not improve within a few days or if they worsen. You may consider chiropractic care once acute pain has resolved. Go to the emergency department if you develop weakness, numbness, tingling in the arms or hands, difficulty walking, loss of bladder or bowel control, or any new or worsening neurological symptoms.

## 2023-12-21 NOTE — ED Provider Notes (Signed)
 [facility]    CSN: [id number] Arrival date & time: 12/21/23  1848      History   Chief Complaint Chief Complaint  Patient presents with   Torticollis    HPI Senia Even is a 23 y.o. adult.   Discussed the use of AI scribe software for clinical note transcription with the patient, who gave verbal consent to proceed.   The patient presents with neck pain that started approximately two weeks ago.  Patient thinks that she slept wrong which caused her symptoms to start.  She denies any injury or trauma.  The pain is primarily localized to the right side of the neck. The patient reports significant muscle tightness and tension in the affected area. She denies numbness or tingling sensations radiating down the arm, as well as any associated headaches. The patient attempted to seek chiropractic treatment for her condition but was told to seek medical intervention first. She has tried Tylenol, Motrin , ice and heat.   The following portions of the patient's history were reviewed and updated as appropriate: allergies, current medications, past family history, past medical history, past social history, past surgical history, and problem list.    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   Fatigue 04/10/2023   Acute cystitis without hematuria 06/26/2022   Need for Tdap vaccination 06/26/2022   Painful menstrual periods 10/06/2021   Healthcare maintenance 10/06/2021   Abnormality of gait 02/03/2021   Genetic testing 12/05/2020   Family history of  prostate cancer 11/19/2020   Family history of uterine cancer 10/28/2020   Iron deficiency anemia 09/29/2020   Weakness of both hands 09/16/2020   Family history of cardiomyopathy 04/30/2020   Anxiety 04/30/2020   Chronic pain of both knees 03/31/2020    Past Surgical History:  Procedure Laterality Date   WISDOM TOOTH EXTRACTION      OB History   No obstetric history on file.      Home Medications    Prior to Admission medications   Medication Sig Start Date End Date Taking? Authorizing Provider  cyclobenzaprine (FLEXERIL) 10 MG tablet Take 1 tablet (10 mg total) by mouth 3 (three) times daily as needed for muscle spasms. Take three times daily starting tonight until Sunday night, then take as needed. 12/21/23  Yes Iola Lukes, FNP  naproxen (NAPROSYN) 500 MG tablet Take 1 tablet (500 mg total) by mouth 2 (two) times daily with a meal. Take with food to avoid stomach upset. Do not take any additional NSAIDs while on this. You may take tylenol in addition to this if needed for extra pain relief. 12/21/23  Yes Iola Lukes, FNP  buPROPion  (WELLBUTRIN  XL) 300 MG 24 hr tablet Take 1 tablet (300 mg total) by mouth daily. Patient not taking: Reported on 06/23/2022 10/12/21   Espinoza, Alejandra, DO  busPIRone  (BUSPAR ) 7.5 MG tablet TAKE 1 TABLET(7.5 MG) BY MOUTH TWICE DAILY 04/28/23   Nicholas Bar, MD  EPINEPHrine  0.3 mg/0.3 mL IJ SOAJ injection Inject 0.3 mg into the muscle as needed  for anaphylaxis. Patient not taking: Reported on 09/12/2022 09/04/22   Espinoza, Alejandra, DO  ferrous sulfate  220 (44 Fe) MG/5ML solution Take 5 mLs (220 mg total) by mouth daily. 05/03/23   Tharon Lung, MD  FLUoxetine  (PROZAC ) 40 MG capsule Take 1 capsule (40 mg total) by mouth daily. 09/12/22   Espinoza, Alejandra, DO  ondansetron  (ZOFRAN -ODT) 4 MG disintegrating tablet Take 1 tablet (4 mg total) by mouth every 6 (six) hours as needed for nausea or vomiting. 11/30/23   Rising, [place]berry, PA-C    Family  History Family History  Problem Relation Age of Onset   Anemia Mother        unknown, reportedly rare, anemia condition   Cardiomyopathy Mother 46   Cardiomyopathy Brother 1   Autism Brother    Fibroids Paternal Aunt    Uterine cancer Paternal Aunt        dx late 28s   Fibroids Paternal Aunt    Uterine cancer Paternal Aunt 30   Fibroids Paternal Aunt    Cervical cancer Paternal Aunt        precancer at 24 yo   Fibroids Paternal Aunt    Lung cancer Paternal Grandmother 49   Uterine cancer Paternal Grandmother        dx 36s, hysterectomy   Prostate cancer Paternal Grandfather 37       metastatic   Cancer Other        hysterectomy due to cancer (dx 53s) in 3 paternal great-aunts   Diabetes Half-Brother    Uterine cancer Paternal Great-grandmother        hysterectomy    Social History Social History   Tobacco Use   Smoking status: Never    Passive exposure: Never   Smokeless tobacco: Never  Vaping Use   Vaping status: Never Used  Substance Use Topics   Alcohol use: Not Currently   Drug use: Never     Allergies   Citrus   Review of Systems Review of Systems  Musculoskeletal:  Positive for neck pain. Negative for neck stiffness.  Neurological:  Negative for weakness, numbness and headaches.  All other systems reviewed and are negative.    Physical Exam Triage Vital Signs ED Triage Vitals  Encounter Vitals Group     BP 12/21/23 1916 113/76     Girls Systolic BP Percentile --      Girls Diastolic BP Percentile --      Boys Systolic BP Percentile --      Boys Diastolic BP Percentile --      Pulse Rate 12/21/23 1914 67     Resp 12/21/23 1914 16     Temp 12/21/23 1914 98.2 F (36.8 C)     Temp Source 12/21/23 1914 Oral     SpO2 12/21/23 1914 99 %     Weight --      Height --      Head Circumference --      Peak Flow --      Pain Score 12/21/23 1916 8     Pain Loc --      Pain Education --      Exclude from Growth Chart --    No data  found.  Updated Vital Signs BP 113/76 (BP Location: Right Arm)   Pulse 67   Temp 98.2 F (36.8 C) (Oral)   Resp 16   LMP 12/08/2023 (Exact Date)   SpO2 99%   Visual Acuity Right Eye Distance:   Left Eye Distance:  Bilateral Distance:    Right Eye Near:   Left Eye Near:    Bilateral Near:     Physical Exam Vitals reviewed.  Constitutional:      General: He is awake. He is not in acute distress.    Appearance: Normal appearance. He is well-developed. He is not ill-appearing, toxic-appearing or diaphoretic.  HENT:     Head: Normocephalic.     Right Ear: Hearing normal.     Left Ear: Hearing normal.     Nose: Nose normal.     Mouth/Throat:     Mouth: Mucous membranes are moist.  Eyes:     General: Vision grossly intact.     Conjunctiva/sclera: Conjunctivae normal.  Cardiovascular:     Rate and Rhythm: Normal rate and regular rhythm.     Heart sounds: Normal heart sounds.  Pulmonary:     Effort: Pulmonary effort is normal.     Breath sounds: Normal breath sounds and air entry.  Musculoskeletal:     Cervical back: Neck supple. No rigidity or crepitus. Pain with movement and muscular tenderness present. No spinous process tenderness. Decreased range of motion (Due to pain).  Lymphadenopathy:     Cervical: No cervical adenopathy.  Skin:    General: Skin is warm and dry.  Neurological:     General: No focal deficit present.     Mental Status: He is alert and oriented to person, place, and time.     Sensory: Sensation is intact. No sensory deficit.     Motor: Motor function is intact. No weakness.     Coordination: Coordination is intact.     Gait: Gait is intact.  Psychiatric:        Speech: Speech normal.        Behavior: Behavior is cooperative.      UC Treatments / Results  Labs (all labs ordered are listed, but only abnormal results are displayed) Labs Reviewed - No data to display  EKG   Radiology No results found.  Procedures Procedures  (including critical care time)  Medications Ordered in UC Medications  dexamethasone (DECADRON) injection 10 mg (10 mg Intramuscular Given 12/21/23 2005)  ketorolac  (TORADOL ) injection 60 mg (60 mg Intramuscular Given 12/21/23 2005)    Initial Impression / Assessment and Plan / UC Course  I have reviewed the triage vital signs and the nursing notes.  Pertinent labs & imaging results that were available during my care of the patient were reviewed by me and considered in my medical decision making (see chart for details).     Patient presents with a two-week history of acute neck pain and right-sided muscle spasm described as tight and tense, without associated numbness, tingling, or headache. Previous treatments have not provided relief. Physical exam reveals localized muscle tightness consistent with acute cervical strain and muscle spasm. In-clinic treatment included administration of Toradol  and Decadron for pain and inflammation. Patient was prescribed naproxen twice daily and advised to avoid additional OTC NSAIDs while taking it; Tylenol may be used for additional pain relief if needed. Flexeril was prescribed to be taken three times daily for two days, then as needed, with guidance that it may be taken only at night if drowsiness occurs. Moist heat therapy was recommended using a warm, damp cloth and low-setting heating pad for 15-20 minutes every few hours. Patient was given exercises to begin once pain subsides and advised to follow up as needed. Chiropractic care may be considered after acute symptoms improve. Emergency care advised  for new neurological symptoms such as numbness, weakness, or loss of coordination.  Today's evaluation has revealed no signs of a dangerous process. Discussed diagnosis with patient and/or guardian. Patient and/or guardian aware of their diagnosis, possible red flag symptoms to watch out for and need for close follow up. Patient and/or guardian understands verbal  and written discharge instructions. Patient and/or guardian comfortable with plan and disposition.  Patient and/or guardian has a clear mental status at this time, good insight into illness (after discussion and teaching) and has clear judgment to make decisions regarding their care  Documentation was completed with the aid of voice recognition software. Transcription may contain typographical errors.  Final Clinical Impressions(s) / UC Diagnoses   Final diagnoses:  Cervical muscle strain, initial encounter     Discharge Instructions      You are being treated for acute neck pain and muscle spasm, likely due to a cervical strain. You were given injections today to help reduce pain and inflammation. You have been prescribed naproxen to take twice daily--do not take additional over-the-counter NSAIDs such as ibuprofen  or aspirin while on this medication. You may take Tylenol if you need additional pain relief. Flexeril was also prescribed to help relax your muscles; take it three times a day for the next two days, then only as needed. This medication can cause drowsiness, so you may take it just at bedtime once you return to work on Monday.   Apply moist heat to your neck by placing a damp washcloth over the area and covering it with a heating pad set to low. Use for 15-20 minutes every few hours as tolerated. Avoid strenuous activity, sudden neck movements, or heavy lifting until symptoms improve. Begin gentle neck stretches and exercises as directed in your handout once pain has decreased.  Follow up with your primary care provider if symptoms do not improve within a few days or if they worsen. You may consider chiropractic care once acute pain has resolved. Go to the emergency department if you develop weakness, numbness, tingling in the arms or hands, difficulty walking, loss of bladder or bowel control, or any new or worsening neurological symptoms.      ED Prescriptions     Medication  Sig Dispense Auth. Provider   naproxen (NAPROSYN) 500 MG tablet Take 1 tablet (500 mg total) by mouth 2 (two) times daily with a meal. Take with food to avoid stomach upset. Do not take any additional NSAIDs while on this. You may take tylenol in addition to this if needed for extra pain relief. 20 tablet Will Schier, FNP   cyclobenzaprine (FLEXERIL) 10 MG tablet Take 1 tablet (10 mg total) by mouth 3 (three) times daily as needed for muscle spasms. Take three times daily starting tonight until Sunday night, then take as needed. 30 tablet Iola Lukes, FNP      PDMP not reviewed this encounter.   Iola Lukes, [place] 12/21/23 2024

## 2023-12-21 NOTE — ED Triage Notes (Signed)
 Pt states right sided neck pain/stiffness for the past 2 weeks.  Denies any injury states she woke up with it.  States the stiffness is sometimes in her right shoulder.  States she has tried ice and heat at home as well as motrin  and tylenol with no relief.

## 2023-12-23 LAB — URINE CULTURE

## 2024-01-01 ENCOUNTER — Encounter: Admitting: Family Medicine

## 2024-05-14 ENCOUNTER — Ambulatory Visit (HOSPITAL_COMMUNITY)

## 2024-06-09 ENCOUNTER — Ambulatory Visit (INDEPENDENT_AMBULATORY_CARE_PROVIDER_SITE_OTHER): Payer: Self-pay | Admitting: Student

## 2024-06-09 VITALS — BP 120/80 | HR 98 | Ht 70.0 in | Wt 253.0 lb

## 2024-06-09 DIAGNOSIS — D500 Iron deficiency anemia secondary to blood loss (chronic): Secondary | ICD-10-CM

## 2024-06-09 DIAGNOSIS — R5383 Other fatigue: Secondary | ICD-10-CM

## 2024-06-09 NOTE — Progress Notes (Signed)
" ° ° °  SUBJECTIVE:   CHIEF COMPLAINT / HPI:   Discussed the use of AI scribe software for clinical note transcription with the patient, who gave verbal consent to proceed.  History of Present Illness Diane Zuniga is a 24 year old female with a history of low iron levels who presents with fatigue and tiredness.  Fatigue and tiredness - Worsening fatigue characterized by feeling run down, tired, and sleepy - Symptoms are interfering with work responsibilities - Similar episodes of fatigue have occurred in the past, previously improved but now returned - No fever or other acute illness - Chronic low iron levels - Oral iron supplementation taken every couple of days, stopped taking due to stomach discomfort - History of two iron infusions over the past four years  Menstrual bleeding - Historically heavy menstrual cycles, unchanged - Not currently on medications to lighten menstrual flow - Prior birth control use resulted in prolonged bleeding - No blood in stool - No diarrhea  Psychiatric symptoms - Increased agitation - Anxiety and depression managed with Wellbutrin  and ongoing psychiatric care - Anemia symptoms tend to flare this time of year, without clear seasonal change in mood   OBJECTIVE:   BP 120/80   Pulse 98   Ht 5' 10 (1.778 m)   Wt 253 lb (114.8 kg)   LMP 06/07/2024   SpO2 99%   BMI 36.30 kg/m    General: NAD, pleasant Cardio: RRR, no MRG. Respiratory: CTAB, normal wob on RA Skin: Warm and dry  ASSESSMENT/PLAN:   Assessment & Plan Other fatigue Iron deficiency anemia due to chronic blood loss -CBC, ferritin, TSH, BMP - Given history, suspect iron deficiency anemia contributing - Will review labs, and follow-up    Follow-up recommendations Due for Pap smear   Gladis Church, DO [facility]  "

## 2024-06-09 NOTE — Assessment & Plan Note (Signed)
-  CBC, ferritin, TSH, BMP - Given history, suspect iron deficiency anemia contributing - Will review labs, and follow-up

## 2024-06-09 NOTE — Patient Instructions (Signed)
  Estuvo muy bien verte! Gracias por permitirme participar en su cuidado!   Nuestros planes para hoy: - Utilice crema de permetrina al 5%. - Por favor acuda a su cita con dermatologa.   Tenga cuidado y busque atencin inmediata lo antes posible si tiene alguna inquietud. Recuerde presentarse 15 minutos antes de la hora programada para su cita!  Martin Bronson, DO [facility]  

## 2024-06-10 ENCOUNTER — Telehealth (HOSPITAL_COMMUNITY): Payer: Self-pay | Admitting: Pharmacist

## 2024-06-10 ENCOUNTER — Ambulatory Visit: Payer: Self-pay | Admitting: Student

## 2024-06-10 ENCOUNTER — Telehealth (HOSPITAL_COMMUNITY): Payer: Self-pay

## 2024-06-10 DIAGNOSIS — D500 Iron deficiency anemia secondary to blood loss (chronic): Secondary | ICD-10-CM

## 2024-06-10 LAB — BASIC METABOLIC PANEL WITH GFR
BUN/Creatinine Ratio: 9 (ref 9–23)
BUN: 7 mg/dL (ref 6–20)
CO2: 21 mmol/L (ref 20–29)
Calcium: 9.3 mg/dL (ref 8.7–10.2)
Chloride: 109 mmol/L — ABNORMAL HIGH (ref 96–106)
Creatinine, Ser: 0.74 mg/dL (ref 0.57–1.00)
Glucose: 106 mg/dL — ABNORMAL HIGH (ref 70–99)
Potassium: 4.5 mmol/L (ref 3.5–5.2)
Sodium: 141 mmol/L (ref 134–144)
eGFR: 116 mL/min/1.73

## 2024-06-10 LAB — FERRITIN: Ferritin: 5 ng/mL — ABNORMAL LOW (ref 15–150)

## 2024-06-10 LAB — CBC
Hematocrit: 29.8 % — ABNORMAL LOW (ref 34.0–46.6)
Hemoglobin: 8.1 g/dL — ABNORMAL LOW (ref 11.1–15.9)
MCH: 19.1 pg — ABNORMAL LOW (ref 26.6–33.0)
MCHC: 27.2 g/dL — ABNORMAL LOW (ref 31.5–35.7)
MCV: 70 fL — ABNORMAL LOW (ref 79–97)
Platelets: 600 x10E3/uL — ABNORMAL HIGH (ref 150–450)
RBC: 4.24 x10E6/uL (ref 3.77–5.28)
RDW: 17.7 % — ABNORMAL HIGH (ref 11.7–15.4)
WBC: 4.2 x10E3/uL (ref 3.4–10.8)

## 2024-06-10 LAB — TSH RFX ON ABNORMAL TO FREE T4: TSH: 1.67 u[IU]/mL (ref 0.450–4.500)

## 2024-06-10 MED ORDER — FERROUS GLUCONATE 324 (38 FE) MG PO TABS: 324.0000 mg | ORAL_TABLET | ORAL | 1 refills | Status: AC

## 2024-06-10 NOTE — Telephone Encounter (Signed)
 Patient referred to infusion pharmacy team for ambulatory infusion of IV iron.  Insurance - [employer] (consulting) PPO Site of care - Site of care: [facility] Dx code - D50.0 IV Iron Therapy - Venofer 200mg  IV x 5 Infusion appointments - Scheduling team will schedule patient as soon as possible  Sherry Pennant, PharmD, MPH, BCPS, CPP Clinical Pharmacist

## 2024-06-10 NOTE — Telephone Encounter (Signed)
 Called patient, confirmed identity. Discussed low hemoglobin and associated low iron.  Discussed options. Using shared decision making, patient opted for IV iron infusion.  Referral sent.  Additionally, oral supplementation has been provided.  All questions answered.

## 2024-06-10 NOTE — Telephone Encounter (Signed)
 Auth Submission: NO AUTH NEEDED Site of care: Site of care: [facility] Payer: [employer] Medication & CPT/J Code(s) submitted: Venofer (Iron Sucrose) J1756 Diagnosis Code: D50.9 Route of submission (phone, fax, portal): portal Phone # Fax # Auth type: Buy/Bill HB Units/visits requested: 200mg  x 5 doses Reference number: [id number] Approval from: 06/10/24 to 09/08/24

## 2024-06-18 ENCOUNTER — Encounter (HOSPITAL_COMMUNITY)
Admission: RE | Admit: 2024-06-18 | Discharge: 2024-06-18 | Disposition: A | Discharge status: Home / Self Care | Source: Ambulatory Visit | Attending: Family Medicine | Admitting: Family Medicine

## 2024-06-18 VITALS — BP 112/68 | HR 79 | Temp 97.8°F | Resp 16

## 2024-06-18 DIAGNOSIS — D500 Iron deficiency anemia secondary to blood loss (chronic): Secondary | ICD-10-CM | POA: Diagnosis present

## 2024-06-18 MED ORDER — IRON SUCROSE 200 MG IVPB - SIMPLE MED
200.0000 mg | Freq: Once | Status: AC
  Administered 2024-06-18: 200 mg via INTRAVENOUS

## 2024-06-18 MED ORDER — IRON SUCROSE 200 MG IVPB - SIMPLE MED
Status: AC
  Filled 2024-06-18: qty 110

## 2024-06-20 ENCOUNTER — Encounter (HOSPITAL_COMMUNITY)
Admission: RE | Admit: 2024-06-20 | Discharge: 2024-06-20 | Disposition: A | Source: Ambulatory Visit | Attending: Family Medicine

## 2024-06-20 VITALS — BP 113/69 | HR 76 | Temp 97.7°F | Resp 16

## 2024-06-20 DIAGNOSIS — D500 Iron deficiency anemia secondary to blood loss (chronic): Secondary | ICD-10-CM

## 2024-06-20 MED ORDER — IRON SUCROSE 200 MG IVPB - SIMPLE MED
Status: AC
  Filled 2024-06-20: qty 110

## 2024-06-20 MED ORDER — IRON SUCROSE 200 MG IVPB - SIMPLE MED
200.0000 mg | Freq: Once | Status: AC
  Administered 2024-06-20: 200 mg via INTRAVENOUS

## 2024-06-23 ENCOUNTER — Encounter (HOSPITAL_COMMUNITY)

## 2024-06-25 ENCOUNTER — Inpatient Hospital Stay (HOSPITAL_COMMUNITY): Admission: RE | Admit: 2024-06-25 | Source: Ambulatory Visit

## 2024-06-27 ENCOUNTER — Inpatient Hospital Stay (HOSPITAL_COMMUNITY): Admission: RE | Admit: 2024-06-27 | Source: Ambulatory Visit
